# Patient Record
Sex: Female | Born: 1990
Health system: Southern US, Community
[De-identification: ages and names within clinical notes are randomized; demographics above are authoritative.]

## PROBLEM LIST (undated history)

## (undated) ENCOUNTER — Inpatient Hospital Stay (HOSPITAL_COMMUNITY): Payer: Self-pay

## (undated) DIAGNOSIS — N390 Urinary tract infection, site not specified: Secondary | ICD-10-CM

## (undated) DIAGNOSIS — J45909 Unspecified asthma, uncomplicated: Secondary | ICD-10-CM

## (undated) DIAGNOSIS — B9689 Other specified bacterial agents as the cause of diseases classified elsewhere: Secondary | ICD-10-CM

## (undated) DIAGNOSIS — R011 Cardiac murmur, unspecified: Secondary | ICD-10-CM

## (undated) DIAGNOSIS — E039 Hypothyroidism, unspecified: Secondary | ICD-10-CM

## (undated) DIAGNOSIS — A749 Chlamydial infection, unspecified: Secondary | ICD-10-CM

## (undated) HISTORY — DX: Chlamydial infection, unspecified: A74.9

## (undated) HISTORY — PX: NO PAST SURGERIES: SHX2092

## (undated) HISTORY — DX: Other specified bacterial agents as the cause of diseases classified elsewhere: B96.89

---
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
## 2011-06-02 ENCOUNTER — Encounter (HOSPITAL_COMMUNITY): Payer: Self-pay

## 2011-06-02 ENCOUNTER — Emergency Department (HOSPITAL_COMMUNITY)
Admission: EM | Admit: 2011-06-02 | Discharge: 2011-06-02 | Disposition: A | Discharge status: Home Health | Payer: PRIVATE HEALTH INSURANCE | Attending: Emergency Medicine | Admitting: Emergency Medicine

## 2011-06-02 DIAGNOSIS — K5289 Other specified noninfective gastroenteritis and colitis: Secondary | ICD-10-CM | POA: Insufficient documentation

## 2011-06-02 DIAGNOSIS — R109 Unspecified abdominal pain: Secondary | ICD-10-CM | POA: Insufficient documentation

## 2011-06-02 DIAGNOSIS — K529 Noninfective gastroenteritis and colitis, unspecified: Secondary | ICD-10-CM

## 2011-06-02 DIAGNOSIS — R112 Nausea with vomiting, unspecified: Secondary | ICD-10-CM | POA: Insufficient documentation

## 2011-06-02 LAB — CBC
HCT: 35.5 % — ABNORMAL LOW (ref 36.0–46.0)
Hemoglobin: 11.2 g/dL — ABNORMAL LOW (ref 12.0–15.0)
RBC: 4.14 MIL/uL (ref 3.87–5.11)
RDW: 13.5 % (ref 11.5–15.5)
WBC: 11.4 10*3/uL — ABNORMAL HIGH (ref 4.0–10.5)

## 2011-06-02 LAB — BASIC METABOLIC PANEL
CO2: 25 mEq/L (ref 19–32)
Chloride: 103 mEq/L (ref 96–112)
Creatinine, Ser: 0.58 mg/dL (ref 0.50–1.10)
Glucose, Bld: 105 mg/dL — ABNORMAL HIGH (ref 70–99)

## 2011-06-02 LAB — URINE MICROSCOPIC-ADD ON

## 2011-06-02 LAB — DIFFERENTIAL
Basophils Absolute: 0 10*3/uL (ref 0.0–0.1)
Lymphocytes Relative: 11 % — ABNORMAL LOW (ref 12–46)
Lymphs Abs: 1.2 10*3/uL (ref 0.7–4.0)
Monocytes Absolute: 0.3 10*3/uL (ref 0.1–1.0)
Monocytes Relative: 2 % — ABNORMAL LOW (ref 3–12)
Neutro Abs: 9.9 10*3/uL — ABNORMAL HIGH (ref 1.7–7.7)

## 2011-06-02 LAB — URINALYSIS, ROUTINE W REFLEX MICROSCOPIC
Bilirubin Urine: NEGATIVE
Glucose, UA: NEGATIVE mg/dL
Specific Gravity, Urine: 1.026 (ref 1.005–1.030)
pH: 8 (ref 5.0–8.0)

## 2011-06-02 LAB — POCT PREGNANCY, URINE: Preg Test, Ur: NEGATIVE

## 2011-06-02 MED ORDER — KETOROLAC TROMETHAMINE 30 MG/ML IJ SOLN
30.0000 mg | Freq: Once | INTRAMUSCULAR | Status: AC
  Administered 2011-06-02: 30 mg via INTRAVENOUS
  Filled 2011-06-02: qty 1

## 2011-06-02 MED ORDER — PROMETHAZINE HCL 25 MG PO TABS: 25.0000 mg | ORAL_TABLET | Freq: Four times a day (QID) | ORAL | Status: AC | PRN

## 2011-06-02 MED ORDER — SODIUM CHLORIDE 0.9 % IV SOLN
Freq: Once | INTRAVENOUS | Status: AC
  Administered 2011-06-02: 10:00:00 via INTRAVENOUS

## 2011-06-02 MED ORDER — ONDANSETRON HCL 4 MG/2ML IJ SOLN
4.0000 mg | Freq: Once | INTRAMUSCULAR | Status: AC
  Administered 2011-06-02: 4 mg via INTRAVENOUS
  Filled 2011-06-02: qty 2

## 2011-06-02 NOTE — ED Notes (Signed)
Pt complains of abd pain no other assosicated symptoms. Pt complains of also spotting, off birthcontrol since nov and sts could be pregenant.

## 2011-06-02 NOTE — ED Notes (Signed)
Patient presents with sharp, constant, bilateral lower quadrant abdominal pain since this AM with mild spotting.  LMP 05-10-11 which was normal. Patient denies other vaginal discharge or foul odor.

## 2011-06-02 NOTE — Discharge Instructions (Signed)
Viral Gastroenteritis Viral gastroenteritis is also known as stomach flu. This condition affects the stomach and intestinal tract. The illness typically lasts 3 to 8 days. Most people develop an immune response. This eventually gets rid of the virus. While this natural response develops, the virus can make you quite ill.  CAUSES  Diarrhea and vomiting are often caused by a virus. Medicines (antibiotics) that kill germs will not help unless there is also a germ (bacterial) infection. SYMPTOMS  The most common symptom is diarrhea. This can cause severe loss of fluids (dehydration) and body salt (electrolyte) imbalance. TREATMENT  Treatments for this illness are aimed at rehydration. Antidiarrheal medicines are not recommended. They do not decrease diarrhea volume and may be harmful. Usually, home treatment is all that is needed. The most serious cases involve vomiting so severely that you are not able to keep down fluids taken by mouth (orally). In these cases, intravenous (IV) fluids are needed. Vomiting with viral gastroenteritis is common, but it will usually go away with treatment. HOME CARE INSTRUCTIONS  Small amounts of fluids should be taken frequently. Large amounts at one time may not be tolerated. Plain water may be harmful in infants and the elderly. Oral rehydration solutions (ORS) are available at pharmacies and grocery stores. ORS replace water and important electrolytes in proper proportions. Sports drinks are not as effective as ORS and may be harmful due to sugars worsening diarrhea.  As a general guideline for children, replace any new fluid losses from diarrhea or vomiting with ORS as follows:   If your child weighs 22 pounds or under (10 kg or less), give 60-120 mL (1/4 - 1/2 cup or 2 - 4 ounces) of ORS for each diarrheal stool or vomiting episode.   If your child weighs more than 22 pounds (more than 10 kgs), give 120-240 mL (1/2 - 1 cup or 4 - 8 ounces) of ORS for each diarrheal  stool or vomiting episode.   In a child with vomiting, it may be helpful to give the above ORS replacement in 5 mL (1 teaspoon) amounts every 5 minutes, then increase as tolerated.   While correcting for dehydration, children should eat normally. However, foods high in sugar should be avoided because this may worsen diarrhea. Large amounts of carbonated soft drinks, juice, gelatin desserts, and other highly sugared drinks should be avoided.   After correction of dehydration, other liquids that are appealing to the child may be added. Children should drink small amounts of fluids frequently and fluids should be increased as tolerated.   Adults should eat normally while drinking more fluids than usual. Drink small amounts of fluids frequently and increase as tolerated. Drink enough water and fluids to keep your urine clear or pale yellow. Broths, weak decaffeinated tea, lemon-lime soft drinks (allowed to go flat), and ORS replace fluids and electrolytes.   Avoid:   Carbonated drinks.   Juice.   Extremely hot or cold fluids.   Caffeine drinks.   Fatty, greasy foods.   Alcohol.   Tobacco.   Too much intake of anything at one time.   Gelatin desserts.   Probiotics are active cultures of beneficial bacteria. They may lessen the amount and number of diarrheal stools in adults. Probiotics can be found in yogurt with active cultures and in supplements.   Wash your hands well to avoid spreading bacteria and viruses.   Antidiarrheal medicines are not recommended for infants and children.   Only take over-the-counter or prescription medicines for   pain, discomfort, or fever as directed by your caregiver. Do not give aspirin to children.   For adults with dehydration, ask your caregiver if you should continue all prescribed and over-the-counter medicines.   If your caregiver has given you a follow-up appointment, it is very important to keep that appointment. Not keeping the appointment  could result in a lasting (chronic) or permanent injury and disability. If there is any problem keeping the appointment, you must call to reschedule.  SEEK IMMEDIATE MEDICAL CARE IF:   You are unable to keep fluids down.   There is no urine output in 6 to 8 hours or there is only a small amount of very dark urine.   You develop shortness of breath.   There is blood in the vomit (may look like coffee grounds) or stool.   Belly (abdominal) pain develops, increases, or localizes.   There is persistent vomiting or diarrhea.   You have a fever.   Your baby is older than 3 months with a rectal temperature of 102 F (38.9 C) or higher.   Your baby is 3 months old or younger with a rectal temperature of 100.4 F (38 C) or higher.  MAKE SURE YOU:   Understand these instructions.   Will watch your condition.   Will get help right away if you are not doing well or get worse.  Document Released: 03/26/2005 Document Revised: 12/06/2010 Document Reviewed: 08/07/2006 [employer] Patient Information 2012 [employer]. 

## 2011-06-02 NOTE — ED Provider Notes (Signed)
History     CSN: [id number]  Arrival date & time 06/02/11  0865   First MD Initiated Contact with Patient 06/02/11 [phone]      Chief Complaint  Patient presents with  . Abdominal Pain    (Consider location/radiation/quality/duration/timing/severity/associated sxs/prior treatment) Patient is a 21 y.o. female presenting with abdominal pain. The history is provided by the patient.  Abdominal Pain The primary symptoms of the illness include abdominal pain, nausea, vomiting and diarrhea. The primary symptoms of the illness do not include fever, dysuria, vaginal discharge or vaginal bleeding. The current episode started yesterday. The problem has been gradually worsening.  The patient states that she believes she is currently not pregnant. The patient has had a change in bowel habit. Symptoms associated with the illness do not include chills, constipation, urgency or frequency.    History reviewed. No pertinent past medical history.  History reviewed. No pertinent past surgical history.  History reviewed. No pertinent family history.  History  Substance Use Topics  . Smoking status: Never Smoker   . Smokeless tobacco: Not on file  . Alcohol Use: No    OB History    Grav Para Term Preterm Abortions TAB SAB Ect Mult Living                  Review of Systems  Constitutional: Negative for fever and chills.  Gastrointestinal: Positive for nausea, vomiting, abdominal pain and diarrhea. Negative for constipation.  Genitourinary: Negative for dysuria, urgency, frequency, vaginal bleeding and vaginal discharge.  All other systems reviewed and are negative.    Allergies  Amoxicillin and Penicillins  Home Medications   Current Outpatient Rx  Name Route Sig Dispense Refill  . LORATADINE 10 MG PO TABS Oral Take 10 mg by mouth daily as needed. For allergies    . SALINE NASAL SPRAY 0.65 % NA SOLN Nasal Place 1 spray into the nose daily as needed. For congestion      BP 121/87   Pulse 75  Temp(Src) 98.5 F (36.9 C) (Oral)  Resp 18  SpO2 99%  LMP 05/10/2011  Physical Exam  Nursing note and vitals reviewed. Constitutional: She is oriented to person, place, and time. She appears well-developed and well-nourished. No distress.  HENT:  Head: Normocephalic and atraumatic.  Neck: Normal range of motion. Neck supple.  Cardiovascular: Normal rate and regular rhythm.  Exam reveals no gallop and no friction rub.   No murmur heard. Pulmonary/Chest: Effort normal and breath sounds normal. No respiratory distress. She has no wheezes.  Abdominal: Soft. Bowel sounds are normal. She exhibits no distension.       Mild ttp suprapubic region.  No rebound or guarding.  Musculoskeletal: Normal range of motion.  Neurological: She is alert and oriented to person, place, and time.  Skin: Skin is warm and dry. She is not diaphoretic.    ED Course  Procedures (including critical care time)   Labs Reviewed  POCT PREGNANCY, URINE  URINALYSIS, ROUTINE W REFLEX MICROSCOPIC  CBC  DIFFERENTIAL  BASIC METABOLIC PANEL   No results found.   No diagnosis found.    MDM  Patient reports symptoms to me that sound more GI in nature.  She feels better with fluids and medications given.  Will discharge to home, to return prn.        Geoffery Lyons, MD 06/02/11 1034

## 2017-02-20 ENCOUNTER — Ambulatory Visit (HOSPITAL_COMMUNITY)
Admission: EM | Admit: 2017-02-20 | Discharge: 2017-02-20 | Disposition: A | Discharge status: Home / Self Care | Payer: PRIVATE HEALTH INSURANCE | Attending: Family Medicine | Admitting: Family Medicine

## 2017-02-20 ENCOUNTER — Encounter (HOSPITAL_COMMUNITY): Payer: Self-pay

## 2017-02-20 ENCOUNTER — Other Ambulatory Visit: Payer: Self-pay

## 2017-02-20 DIAGNOSIS — K5900 Constipation, unspecified: Secondary | ICD-10-CM | POA: Diagnosis not present

## 2017-02-20 DIAGNOSIS — M545 Low back pain: Secondary | ICD-10-CM | POA: Insufficient documentation

## 2017-02-20 DIAGNOSIS — R350 Frequency of micturition: Secondary | ICD-10-CM | POA: Insufficient documentation

## 2017-02-20 LAB — POCT URINALYSIS DIP (DEVICE)
BILIRUBIN URINE: NEGATIVE
Glucose, UA: NEGATIVE mg/dL
HGB URINE DIPSTICK: NEGATIVE
Ketones, ur: NEGATIVE mg/dL
LEUKOCYTES UA: NEGATIVE
Nitrite: NEGATIVE
Protein, ur: NEGATIVE mg/dL
Specific Gravity, Urine: 1.015 (ref 1.005–1.030)
Urobilinogen, UA: 0.2 mg/dL (ref 0.0–1.0)
pH: 7 (ref 5.0–8.0)

## 2017-02-20 LAB — POCT PREGNANCY, URINE: Preg Test, Ur: NEGATIVE

## 2017-02-20 MED ORDER — POLYETHYLENE GLYCOL 3350 17 G PO PACK: 17.0000 g | PACK | Freq: Every day | ORAL | 0 refills | Status: DC

## 2017-02-20 MED ORDER — NAPROXEN 500 MG PO TABS: 500.0000 mg | ORAL_TABLET | Freq: Two times a day (BID) | ORAL | 0 refills | Status: AC

## 2017-02-20 MED ORDER — DOCUSATE SODIUM 50 MG PO CAPS: 50.0000 mg | ORAL_CAPSULE | Freq: Two times a day (BID) | ORAL | 0 refills | Status: DC

## 2017-02-20 NOTE — Discharge Instructions (Signed)
Urine negative for UTI/pregnancy. Urine culture sent, you will be contacted with any positive results, additional treatment needed will be called in then. As discussed, symptoms could also be due to constipation, muscle strain. Take naproxen as directed for pain and inflammation. Colace and miralax for constipation. Monitor for any worsening symptoms, fever, nausea, vomiting, weakness, dizziness, follow up for reevaluation.

## 2017-02-20 NOTE — ED Triage Notes (Signed)
[facility], pt states she was recently on antibiotics for Trich about 2 weeks ago, but upon waking this morning she has been having lower back pain and odor in urine

## 2017-02-20 NOTE — ED Provider Notes (Signed)
[facility]    CSN: [id number] Arrival date & time: 02/20/17  1800     History   Chief Complaint Chief Complaint  Patient presents with  . Urinary Tract Infection    HPI Sonya Castro is a 26 y.o. female.   26 year old female with 1 day history of left sided low back pain with urinary frequency, odor in urine. Back pain is a constant pressure, without aggravating or alleviating factor. Denies abdominal pain, nausea, vomiting. Recently treated for trichomonas on 10/20 and finished it 10/27. States medication was to cover for trichomonas, BV and yeast. Has not been sexually active since last tested. Denies vaginal discharge, itching/pain, spotting. LMP 02/04/2017. Patient with constipation, last BM today with some straining. Recently with new soap. Denies fever, chills, night sweats.        History reviewed. No pertinent past medical history.  There are no active problems to display for this patient.   History reviewed. No pertinent surgical history.  OB History    No data available       Home Medications    Prior to Admission medications   Medication Sig Start Date End Date Taking? Authorizing Provider  docusate sodium (COLACE) 50 MG capsule Take 1 capsule (50 mg total) 2 (two) times daily by mouth. 02/20/17   Valdez Brannan V, PA-C  loratadine (CLARITIN) 10 MG tablet Take 10 mg by mouth daily as needed. For allergies    [provider]  naproxen (NAPROSYN) 500 MG tablet Take 1 tablet (500 mg total) 2 (two) times daily for 10 days by mouth. 02/20/17 03/02/17  Cathie HoopsYu, Remmy Riffe V, PA-C  polyethylene glycol [facility](MIRALAX) packet Take 17 g daily by mouth. 02/20/17   Cathie HoopsYu, Johnita Palleschi V, PA-C  sodium chloride (OCEAN) 0.65 % nasal spray Place 1 spray into the nose daily as needed. For congestion    [provider]    Family History History reviewed. No pertinent family history.  Social History Social History   Tobacco Use  . Smoking status: Never Smoker  . Smokeless  tobacco: Never Used  Substance Use Topics  . Alcohol use: No  . Drug use: No     Allergies   Amoxicillin and Penicillins   Review of Systems Review of Systems  Reason unable to perform ROS: See HPI as above.     Physical Exam Triage Vital Signs ED Triage Vitals  Enc Vitals Group     BP 02/20/17 1831 128/70     Pulse Rate 02/20/17 1831 91     Resp 02/20/17 1831 16     Temp 02/20/17 1831 98.3 F (36.8 C)     Temp Source 02/20/17 1831 Oral     SpO2 02/20/17 1831 100 %     Weight --      Height --      Head Circumference --      Peak Flow --      Pain Score 02/20/17 1833 6     Pain Loc --      Pain Edu? --      Excl. in GC? --    No data found.  Updated Vital Signs BP 128/70 (BP Location: Right Arm)   Pulse 91   Temp 98.3 F (36.8 C) (Oral)   Resp 16   LMP 02/04/2017 (Exact Date)   SpO2 100%   Physical Exam  Constitutional: She is oriented to person, place, and time. She appears well-developed and well-nourished. No distress.  HENT:  Head: Normocephalic  and atraumatic.  Eyes: Conjunctivae are normal. Pupils are equal, round, and reactive to light.  Cardiovascular: Normal rate, regular rhythm and normal heart sounds. Exam reveals no gallop and no friction rub.  No murmur heard. Pulmonary/Chest: Effort normal and breath sounds normal. She has no wheezes. She has no rales.  Abdominal: Soft. Bowel sounds are normal. She exhibits no mass. There is no tenderness. There is no rebound, no guarding and no CVA tenderness.  Musculoskeletal:  No tenderness on palpation of spinous processes. States mild tenderness on palpation of left lumber region where experiencing pressure. Full ROM.   Neurological: She is alert and oriented to person, place, and time.  Skin: Skin is warm and dry.  Psychiatric: She has a normal mood and affect. Her behavior is normal. Judgment normal.     UC Treatments / Results  Labs (all labs ordered are listed, but only abnormal results are  displayed) Labs Reviewed  URINE CULTURE  POCT PREGNANCY, URINE  POCT URINALYSIS DIP (DEVICE)    EKG  EKG Interpretation None       Radiology No results found.  Procedures Procedures (including critical care time)  Medications Ordered in UC Medications - No data to display   Initial Impression / Assessment and Plan / UC Course  I have reviewed the triage vital signs and the nursing notes.  Pertinent labs & imaging results that were available during my care of the patient were reviewed by me and considered in my medical decision making (see chart for details).    Patient without tenderness to palpation of the abdomen, no CVA tenderness, afebrile, in no acute distress. Urine negative for UTI/pregnancy. Given symptoms, will send for culture. Discussed possible muscle strain/constipation causing symptoms. Start naproxen for back pain. Colace/miralax for constipation. Discontinue new exposures. Return precautions given.   Final Clinical Impressions(s) / UC Diagnoses   Final diagnoses:  Constipation, unspecified constipation type    ED Discharge Orders        Ordered    naproxen (NAPROSYN) 500 MG tablet  2 times daily     02/20/17 1910    docusate sodium (COLACE) 50 MG capsule  2 times daily     02/20/17 1910    polyethylene glycol (MIRALAX) packet  Daily     02/20/17 1910        Lurline IdolYu, Sammie Schermerhorn V, PA-C 02/20/17 1915

## 2017-02-22 LAB — URINE CULTURE: Culture: NO GROWTH

## 2017-02-26 ENCOUNTER — Emergency Department (HOSPITAL_BASED_OUTPATIENT_CLINIC_OR_DEPARTMENT_OTHER)
Admission: EM | Admit: 2017-02-26 | Discharge: 2017-02-27 | Disposition: A | Discharge status: Home / Self Care | Payer: PRIVATE HEALTH INSURANCE | Attending: Emergency Medicine | Admitting: Emergency Medicine

## 2017-02-26 ENCOUNTER — Other Ambulatory Visit: Payer: Self-pay

## 2017-02-26 ENCOUNTER — Encounter (HOSPITAL_BASED_OUTPATIENT_CLINIC_OR_DEPARTMENT_OTHER): Payer: Self-pay | Admitting: *Deleted

## 2017-02-26 DIAGNOSIS — Z8619 Personal history of other infectious and parasitic diseases: Secondary | ICD-10-CM | POA: Insufficient documentation

## 2017-02-26 DIAGNOSIS — N760 Acute vaginitis: Secondary | ICD-10-CM | POA: Insufficient documentation

## 2017-02-26 DIAGNOSIS — B9689 Other specified bacterial agents as the cause of diseases classified elsewhere: Secondary | ICD-10-CM | POA: Insufficient documentation

## 2017-02-26 LAB — URINALYSIS, ROUTINE W REFLEX MICROSCOPIC
BILIRUBIN URINE: NEGATIVE
Glucose, UA: NEGATIVE mg/dL
Hgb urine dipstick: NEGATIVE
Ketones, ur: NEGATIVE mg/dL
Leukocytes, UA: NEGATIVE
NITRITE: NEGATIVE
PH: 6 (ref 5.0–8.0)
Protein, ur: NEGATIVE mg/dL
SPECIFIC GRAVITY, URINE: 1.025 (ref 1.005–1.030)

## 2017-02-26 LAB — PREGNANCY, URINE: Preg Test, Ur: NEGATIVE

## 2017-02-26 NOTE — ED Triage Notes (Signed)
Vaginal irritation and odor. Itching.

## 2017-02-27 LAB — GC/CHLAMYDIA PROBE AMP (~~LOC~~) NOT AT ARMC
CHLAMYDIA, DNA PROBE: NEGATIVE
NEISSERIA GONORRHEA: NEGATIVE

## 2017-02-27 LAB — WET PREP, GENITAL
Sperm: NONE SEEN
Trich, Wet Prep: NONE SEEN
Yeast Wet Prep HPF POC: NONE SEEN

## 2017-02-27 MED ORDER — METRONIDAZOLE 500 MG PO TABS: 500.0000 mg | ORAL_TABLET | Freq: Two times a day (BID) | ORAL | 0 refills | Status: DC

## 2017-02-27 MED ORDER — METRONIDAZOLE 500 MG PO TABS
500.0000 mg | ORAL_TABLET | Freq: Once | ORAL | Status: AC
  Administered 2017-02-27: 500 mg via ORAL
  Filled 2017-02-27: qty 1

## 2017-02-27 NOTE — ED Provider Notes (Signed)
[facility] Provider Note: Lowella DellJ. Lane Giorgio Chabot, MD, FACEP  CSN: [id number] MRN: [id number] ARRIVAL: 02/26/17 at 2027 ROOM: [facility]   CHIEF COMPLAINT  Vaginal Itching   HISTORY OF PRESENT ILLNESS  02/27/17 12:09 AM Sonya Castro is a 26 y.o. female who was treated for trichomoniasis last month.  Since having her last menstrual period about 2 and half weeks ago she has had intermittent abnormal vaginal odor.  There is associated mild vaginal itching and discomfort but she denies frank pain.  She denies abnormal discharge.  She denies fever, chills, vaginal bleeding, nausea, vomiting or diarrhea.  She denies dysuria or hematuria.   History reviewed. No pertinent past medical history.  History reviewed. No pertinent surgical history.  No family history on file.  Social History   Tobacco Use  . Smoking status: Never Smoker  . Smokeless tobacco: Never Used  Substance Use Topics  . Alcohol use: No  . Drug use: No    Prior to Admission medications   Medication Sig Start Date End Date Taking? Authorizing Provider  docusate sodium (COLACE) 50 MG capsule Take 1 capsule (50 mg total) 2 (two) times daily by mouth. 02/20/17   Yu, Amy V, PA-C  loratadine (CLARITIN) 10 MG tablet Take 10 mg by mouth daily as needed. For allergies    [provider]  naproxen (NAPROSYN) 500 MG tablet Take 1 tablet (500 mg total) 2 (two) times daily for 10 days by mouth. 02/20/17 03/02/17  Cathie HoopsYu, Amy V, PA-C  polyethylene glycol [facility](MIRALAX) packet Take 17 g daily by mouth. 02/20/17   Cathie HoopsYu, Amy V, PA-C  sodium chloride (OCEAN) 0.65 % nasal spray Place 1 spray into the nose daily as needed. For congestion    [provider]    Allergies Amoxicillin and Penicillins   REVIEW OF SYSTEMS  Negative except as noted here or in the History of Present Illness.   PHYSICAL EXAMINATION  Initial Vital Signs Blood pressure 100/60, pulse 84, temperature 97.8 F (36.6 C), temperature source Oral,  resp. rate 18, height 5' (1.524 m), weight 83.9 kg (185 lb), last menstrual period 02/04/2017, SpO2 100 %.  Examination General: Well-developed, well-nourished female in no acute distress; appearance consistent with age of record HENT: normocephalic; atraumatic Eyes: pupils equal, round and reactive to light; extraocular muscles intact Neck: supple Heart: regular rate and rhythm Lungs: clear to auscultation bilaterally Abdomen: soft; nondistended; minimal suprapubic tenderness; no masses or hepatosplenomegaly; bowel sounds present GU: Normal external genitalia; physiologic appearing vaginal discharge; no vaginal bleeding; no cervical motion tenderness; minimal bilateral adnexal tenderness Extremities: No deformity; full range of motion; pulses normal Neurologic: Awake, alert and oriented; motor function intact in all extremities and symmetric; no facial droop Skin: Warm and dry Psychiatric: Normal mood and affect   RESULTS  Summary of this visit's results, reviewed by myself:   EKG Interpretation  Date/Time:    Ventricular Rate:    PR Interval:    QRS Duration:   QT Interval:    QTC Calculation:   R Axis:     Text Interpretation:        Laboratory Studies: Results for orders placed or performed during the hospital encounter of 02/26/17 (from the past 24 hour(s))  Pregnancy, urine     Status: None   Collection Time: 02/26/17  8:55 PM  Result Value Ref Range   Preg Test, Ur NEGATIVE NEGATIVE  Urinalysis, Routine w reflex microscopic     Status: None   Collection Time: 02/26/17  8:55 PM  Result Value Ref Range   Color, Urine YELLOW YELLOW   APPearance CLEAR CLEAR   Specific Gravity, Urine 1.025 1.005 - 1.030   pH 6.0 5.0 - 8.0   Glucose, UA NEGATIVE NEGATIVE mg/dL   Hgb urine dipstick NEGATIVE NEGATIVE   Bilirubin Urine NEGATIVE NEGATIVE   Ketones, ur NEGATIVE NEGATIVE mg/dL   Protein, ur NEGATIVE NEGATIVE mg/dL   Nitrite NEGATIVE NEGATIVE   Leukocytes, UA NEGATIVE  NEGATIVE  Wet prep, genital     Status: Abnormal   Collection Time: 02/27/17 12:18 AM  Result Value Ref Range   Yeast Wet Prep HPF POC NONE SEEN NONE SEEN   Trich, Wet Prep NONE SEEN NONE SEEN   Clue Cells Wet Prep HPF POC PRESENT (A) NONE SEEN   WBC, Wet Prep HPF POC MANY (A) NONE SEEN   Sperm NONE SEEN    Imaging Studies: No results found.  ED COURSE  Nursing notes and initial vitals signs, including pulse oximetry, reviewed.  Vitals:   02/26/17 2053 02/26/17 2058 02/26/17 2345  BP:  113/76 100/60  Pulse:  (!) 101 84  Resp:  18 18  Temp:  97.8 F (36.6 C)   TempSrc:  Oral   SpO2:  100% 100%  Weight: 83.9 kg (185 lb)    Height: 5' (1.524 m)      PROCEDURES    ED DIAGNOSES     ICD-10-CM   1. Bacterial vaginosis N76.0    B96.89        Amberle Lyter, MD 02/27/17 [id number]

## 2019-03-10 DIAGNOSIS — Z0189 Encounter for other specified special examinations: Secondary | ICD-10-CM | POA: Diagnosis not present

## 2019-03-10 DIAGNOSIS — O26892 Other specified pregnancy related conditions, second trimester: Secondary | ICD-10-CM | POA: Diagnosis not present

## 2019-03-10 DIAGNOSIS — R519 Headache, unspecified: Secondary | ICD-10-CM | POA: Diagnosis not present

## 2019-03-10 DIAGNOSIS — Z3A15 15 weeks gestation of pregnancy: Secondary | ICD-10-CM | POA: Diagnosis not present

## 2019-03-10 DIAGNOSIS — O26891 Other specified pregnancy related conditions, first trimester: Secondary | ICD-10-CM | POA: Diagnosis not present

## 2019-03-17 DIAGNOSIS — Z3402 Encounter for supervision of normal first pregnancy, second trimester: Secondary | ICD-10-CM | POA: Diagnosis not present

## 2019-03-26 DIAGNOSIS — Z03818 Encounter for observation for suspected exposure to other biological agents ruled out: Secondary | ICD-10-CM | POA: Diagnosis not present

## 2019-03-26 DIAGNOSIS — Z20828 Contact with and (suspected) exposure to other viral communicable diseases: Secondary | ICD-10-CM | POA: Diagnosis not present

## 2019-04-09 DIAGNOSIS — Z3492 Encounter for supervision of normal pregnancy, unspecified, second trimester: Secondary | ICD-10-CM | POA: Diagnosis not present

## 2019-05-06 DIAGNOSIS — Z3A23 23 weeks gestation of pregnancy: Secondary | ICD-10-CM | POA: Diagnosis not present

## 2019-05-06 DIAGNOSIS — R1084 Generalized abdominal pain: Secondary | ICD-10-CM | POA: Diagnosis not present

## 2019-05-06 DIAGNOSIS — O26892 Other specified pregnancy related conditions, second trimester: Secondary | ICD-10-CM | POA: Diagnosis not present

## 2019-05-14 DIAGNOSIS — E039 Hypothyroidism, unspecified: Secondary | ICD-10-CM | POA: Diagnosis not present

## 2019-05-14 DIAGNOSIS — N898 Other specified noninflammatory disorders of vagina: Secondary | ICD-10-CM | POA: Diagnosis not present

## 2019-06-11 DIAGNOSIS — Z3402 Encounter for supervision of normal first pregnancy, second trimester: Secondary | ICD-10-CM | POA: Diagnosis not present

## 2019-06-11 DIAGNOSIS — Z3A28 28 weeks gestation of pregnancy: Secondary | ICD-10-CM | POA: Diagnosis not present

## 2019-06-11 DIAGNOSIS — Z331 Pregnant state, incidental: Secondary | ICD-10-CM | POA: Diagnosis not present

## 2019-06-11 DIAGNOSIS — Z3403 Encounter for supervision of normal first pregnancy, third trimester: Secondary | ICD-10-CM | POA: Diagnosis not present

## 2019-06-30 DIAGNOSIS — O26849 Uterine size-date discrepancy, unspecified trimester: Secondary | ICD-10-CM | POA: Diagnosis not present

## 2019-06-30 DIAGNOSIS — Z3493 Encounter for supervision of normal pregnancy, unspecified, third trimester: Secondary | ICD-10-CM | POA: Diagnosis not present

## 2019-07-30 DIAGNOSIS — F419 Anxiety disorder, unspecified: Secondary | ICD-10-CM | POA: Diagnosis not present

## 2019-07-30 DIAGNOSIS — Z3483 Encounter for supervision of other normal pregnancy, third trimester: Secondary | ICD-10-CM | POA: Diagnosis not present

## 2019-07-30 DIAGNOSIS — O99344 Other mental disorders complicating childbirth: Secondary | ICD-10-CM | POA: Diagnosis not present

## 2019-07-30 DIAGNOSIS — Z331 Pregnant state, incidental: Secondary | ICD-10-CM | POA: Diagnosis not present

## 2019-07-30 DIAGNOSIS — O42013 Preterm premature rupture of membranes, onset of labor within 24 hours of rupture, third trimester: Secondary | ICD-10-CM | POA: Diagnosis not present

## 2019-07-30 DIAGNOSIS — D649 Anemia, unspecified: Secondary | ICD-10-CM | POA: Insufficient documentation

## 2019-07-30 DIAGNOSIS — O42919 Preterm premature rupture of membranes, unspecified as to length of time between rupture and onset of labor, unspecified trimester: Secondary | ICD-10-CM | POA: Diagnosis not present

## 2019-07-30 DIAGNOSIS — E669 Obesity, unspecified: Secondary | ICD-10-CM | POA: Diagnosis not present

## 2019-07-30 DIAGNOSIS — O42913 Preterm premature rupture of membranes, unspecified as to length of time between rupture and onset of labor, third trimester: Secondary | ICD-10-CM | POA: Diagnosis not present

## 2019-07-30 DIAGNOSIS — O99214 Obesity complicating childbirth: Secondary | ICD-10-CM | POA: Diagnosis not present

## 2019-07-30 DIAGNOSIS — M6250 Muscle wasting and atrophy, not elsewhere classified, unspecified site: Secondary | ICD-10-CM | POA: Diagnosis not present

## 2019-07-30 DIAGNOSIS — Z743 Need for continuous supervision: Secondary | ICD-10-CM | POA: Diagnosis not present

## 2019-07-30 DIAGNOSIS — E039 Hypothyroidism, unspecified: Secondary | ICD-10-CM | POA: Diagnosis not present

## 2019-07-30 DIAGNOSIS — Z3A35 35 weeks gestation of pregnancy: Secondary | ICD-10-CM | POA: Diagnosis not present

## 2019-07-30 DIAGNOSIS — O99284 Endocrine, nutritional and metabolic diseases complicating childbirth: Secondary | ICD-10-CM | POA: Diagnosis not present

## 2019-07-30 DIAGNOSIS — Z880 Allergy status to penicillin: Secondary | ICD-10-CM | POA: Diagnosis not present

## 2019-09-17 DIAGNOSIS — Z30430 Encounter for insertion of intrauterine contraceptive device: Secondary | ICD-10-CM | POA: Diagnosis not present

## 2019-10-27 DIAGNOSIS — N939 Abnormal uterine and vaginal bleeding, unspecified: Secondary | ICD-10-CM | POA: Diagnosis not present

## 2019-10-27 DIAGNOSIS — E039 Hypothyroidism, unspecified: Secondary | ICD-10-CM | POA: Diagnosis not present

## 2019-10-27 DIAGNOSIS — Z3040 Encounter for surveillance of contraceptives, unspecified: Secondary | ICD-10-CM | POA: Diagnosis not present

## 2020-08-26 ENCOUNTER — Emergency Department
Admission: EM | Admit: 2020-08-26 | Discharge: 2020-08-26 | Disposition: A | Discharge status: Home / Self Care | Payer: Medicaid Other | Attending: Emergency Medicine | Admitting: Emergency Medicine

## 2020-08-26 ENCOUNTER — Other Ambulatory Visit: Payer: Self-pay

## 2020-08-26 DIAGNOSIS — R109 Unspecified abdominal pain: Secondary | ICD-10-CM | POA: Insufficient documentation

## 2020-08-26 DIAGNOSIS — R300 Dysuria: Secondary | ICD-10-CM | POA: Insufficient documentation

## 2020-08-26 DIAGNOSIS — R197 Diarrhea, unspecified: Secondary | ICD-10-CM | POA: Insufficient documentation

## 2020-08-26 DIAGNOSIS — R059 Cough, unspecified: Secondary | ICD-10-CM | POA: Diagnosis not present

## 2020-08-26 DIAGNOSIS — Z20822 Contact with and (suspected) exposure to covid-19: Secondary | ICD-10-CM | POA: Insufficient documentation

## 2020-08-26 DIAGNOSIS — R112 Nausea with vomiting, unspecified: Secondary | ICD-10-CM | POA: Diagnosis not present

## 2020-08-26 LAB — URINALYSIS, COMPLETE (UACMP) WITH MICROSCOPIC
Bilirubin Urine: NEGATIVE
Glucose, UA: NEGATIVE mg/dL
Ketones, ur: NEGATIVE mg/dL
Nitrite: NEGATIVE
Protein, ur: NEGATIVE mg/dL
Specific Gravity, Urine: 1.024 (ref 1.005–1.030)
pH: 5 (ref 5.0–8.0)

## 2020-08-26 LAB — COMPREHENSIVE METABOLIC PANEL
ALT: 17 U/L (ref 0–44)
AST: 20 U/L (ref 15–41)
Albumin: 4.3 g/dL (ref 3.5–5.0)
Alkaline Phosphatase: 77 U/L (ref 38–126)
Anion gap: 8 (ref 5–15)
BUN: 9 mg/dL (ref 6–20)
CO2: 22 mmol/L (ref 22–32)
Calcium: 8.8 mg/dL — ABNORMAL LOW (ref 8.9–10.3)
Chloride: 105 mmol/L (ref 98–111)
Creatinine, Ser: 0.53 mg/dL (ref 0.44–1.00)
GFR, Estimated: >60 mL/min (ref >60)
Glucose, Bld: 132 mg/dL — ABNORMAL HIGH (ref 70–99)
Potassium: 3.9 mmol/L (ref 3.5–5.1)
Sodium: 135 mmol/L (ref 135–145)
Total Bilirubin: 0.5 mg/dL (ref 0.3–1.2)
Total Protein: 8 g/dL (ref 6.5–8.1)

## 2020-08-26 LAB — RESP PANEL BY RT-PCR (FLU A&B, COVID) ARPGX2
Influenza A by PCR: NEGATIVE
Influenza B by PCR: NEGATIVE
SARS Coronavirus 2 by RT PCR: NEGATIVE

## 2020-08-26 LAB — CBC
HCT: 37.4 % (ref 36.0–46.0)
Hemoglobin: 12 g/dL (ref 12.0–15.0)
MCH: 26.3 pg (ref 26.0–34.0)
MCHC: 32.1 g/dL (ref 30.0–36.0)
MCV: 82 fL (ref 80.0–100.0)
Platelets: 256 10*3/uL (ref 150–400)
RBC: 4.56 MIL/uL (ref 3.87–5.11)
RDW: 14.6 % (ref 11.5–15.5)
WBC: 13.8 10*3/uL — ABNORMAL HIGH (ref 4.0–10.5)
nRBC: 0 % (ref 0.0–0.2)

## 2020-08-26 LAB — LIPASE, BLOOD: Lipase: 26 U/L (ref 11–51)

## 2020-08-26 LAB — POC URINE PREG, ED: Preg Test, Ur: NEGATIVE

## 2020-08-26 MED ORDER — FOSFOMYCIN TROMETHAMINE 3 G PO PACK
3.0000 g | PACK | Freq: Once | ORAL | Status: AC
  Administered 2020-08-26: 3 g via ORAL
  Filled 2020-08-26: qty 3

## 2020-08-26 MED ORDER — ONDANSETRON HCL 4 MG/2ML IJ SOLN
4.0000 mg | Freq: Once | INTRAMUSCULAR | Status: AC
  Administered 2020-08-26: 4 mg via INTRAVENOUS
  Filled 2020-08-26: qty 2

## 2020-08-26 MED ORDER — DICYCLOMINE HCL 10 MG PO CAPS: 10.0000 mg | ORAL_CAPSULE | Freq: Three times a day (TID) | ORAL | 0 refills | Status: DC | PRN

## 2020-08-26 MED ORDER — SODIUM CHLORIDE 0.9 % IV BOLUS
1000.0000 mL | Freq: Once | INTRAVENOUS | Status: AC
  Administered 2020-08-26: 1000 mL via INTRAVENOUS

## 2020-08-26 MED ORDER — KETOROLAC TROMETHAMINE 30 MG/ML IJ SOLN
30.0000 mg | Freq: Once | INTRAMUSCULAR | Status: AC
  Administered 2020-08-26: 30 mg via INTRAVENOUS
  Filled 2020-08-26: qty 1

## 2020-08-26 MED ORDER — ONDANSETRON 4 MG PO TBDP: 4.0000 mg | ORAL_TABLET | Freq: Three times a day (TID) | ORAL | 0 refills | Status: DC | PRN

## 2020-08-26 NOTE — ED Triage Notes (Signed)
Pt in with co n.v.d since am with generalized abd pain. No fever, has had some dysuria.

## 2020-08-26 NOTE — ED Provider Notes (Signed)
[facility] Emergency Department Provider Note  Time seen: 5:44 AM  I have reviewed the triage vital signs and the nursing notes.   HISTORY  Chief Complaint Abdominal Pain   HPI Sonya Castro is a 30 y.o. female with no significant past medical history presents to the emergency department with complaints of nausea vomiting diarrhea and dysuria.  According to the patient for the past 2 days she has been experiencing nausea vomiting diarrhea with vague abdominal cramping as well as mild dysuria which she describes as a pressure sensation when she urinates.  Patient denies any fever but states she has had chills at home.  She has also had a dry cough at times per patient as well but no known fever.  Does not feel short of breath.  Denies any focal abdominal pain but states generalized cramping.   No past medical history on file.  There are no problems to display for this patient.   No past surgical history on file.  Prior to Admission medications   Medication Sig Start Date End Date Taking? Authorizing Provider  docusate sodium (COLACE) 50 MG capsule Take 1 capsule (50 mg total) 2 (two) times daily by mouth. 02/20/17   Yu, Amy V, PA-C  loratadine (CLARITIN) 10 MG tablet Take 10 mg by mouth daily as needed. For allergies    [provider]  metroNIDAZOLE (FLAGYL) 500 MG tablet Take 1 tablet (500 mg total) by mouth 2 (two) times daily. One po bid x 7 days 02/27/17   Molpus, John, MD  polyethylene glycol [facility]) packet Take 17 g daily by mouth. 02/20/17   Cathie Hoops, Amy V, PA-C  sodium chloride (OCEAN) 0.65 % nasal spray Place 1 spray into the nose daily as needed. For congestion    [provider]    Allergies  Allergen Reactions  . Amoxicillin Hives  . Penicillins Hives, Itching and Swelling    No family history on file.  Social History Social History   Tobacco Use  . Smoking status: Never Smoker  . Smokeless tobacco: Never Used  Substance  Use Topics  . Alcohol use: No  . Drug use: No    Review of Systems Constitutional: Negative for fever.  Positive for chills Cardiovascular: Negative for chest pain. Respiratory: Negative for shortness of breath. Gastrointestinal: Positive for abdominal pain/cramping.  Positive nausea vomiting diarrhea. Genitourinary: Mild pressure when urinating. Musculoskeletal: Negative for musculoskeletal complaints Neurological: Negative for headache All other ROS negative  ____________________________________________   PHYSICAL EXAM:  VITAL SIGNS: ED Triage Vitals  Enc Vitals Group     BP 08/26/20 0045 122/70     Pulse Rate 08/26/20 0045 (!) 118     Resp 08/26/20 0045 20     Temp 08/26/20 0045 98.9 F (37.2 C)     Temp Source 08/26/20 0045 Oral     SpO2 08/26/20 0045 99 %     Weight 08/26/20 0044 172 lb (78 kg)     Height 08/26/20 0044 5' (1.524 m)     Head Circumference --      Peak Flow --      Pain Score 08/26/20 0044 9     Pain Loc --      Pain Edu? --      Excl. in GC? --     Constitutional: Alert and oriented. Well appearing and in no distress. Eyes: Normal exam ENT      Head: Normocephalic and atraumatic.      Mouth/Throat: Mucous membranes  are moist. Cardiovascular: Normal rate, regular rhythm.  Respiratory: Normal respiratory effort without tachypnea nor retractions. Breath sounds are clear Gastrointestinal: Soft, slight diffuse tenderness without focal tenderness identified.  No rebound guarding or distention. Musculoskeletal: Nontender with normal range of motion in all extremities. Neurologic:  Normal speech and language. No gross focal neurologic deficits  Skin:  Skin is warm, dry and intact.  Psychiatric: Mood and affect are normal.   ____________________________________________   INITIAL IMPRESSION / ASSESSMENT AND PLAN / ED COURSE  Pertinent labs & imaging results that were available during my care of the patient were reviewed by me and considered in my  medical decision making (see chart for details).   Patient presents emergency department for nausea vomiting diarrhea and mild dysuria.  Patient's lab work shows mild leukocytosis.  Abdomen is overall reassuring on exam very slight diffuse tenderness without focal tenderness identified.  Highly suspect gastroenteritis.  Patient's urinalysis is equivocal for urinary tract infection.  Given the patient's symptoms we will treat with a one-time dose of fosfomycin NSTEMI urine culture.  We will IV hydrate, treat nausea and continue to closely monitor.  Patient agreeable to plan.  Patient's work-up shows equivocal UTI otherwise negative.  COVID-negative.  We will discharge patient home with Bentyl and Zofran have the patient follow-up with her doctor.  Sonya Castro was evaluated in Emergency Department on 08/26/2020 for the symptoms described in the history of present illness. She was evaluated in the context of the global COVID-19 pandemic, which necessitated consideration that the patient might be at risk for infection with the SARS-CoV-2 virus that causes COVID-19. Institutional protocols and algorithms that pertain to the evaluation of patients at risk for COVID-19 are in a state of rapid change based on information released by regulatory bodies including the CDC and federal and state organizations. These policies and algorithms were followed during the patient's care in the ED.  ____________________________________________   FINAL CLINICAL IMPRESSION(S) / ED DIAGNOSES  Nausea vomiting diarrhea Dysuria   Minna Antis, MD 08/26/20 [phone]

## 2020-08-27 LAB — URINE CULTURE: Culture: 80000 — AB

## 2020-11-26 ENCOUNTER — Emergency Department (HOSPITAL_BASED_OUTPATIENT_CLINIC_OR_DEPARTMENT_OTHER)
Admission: EM | Admit: 2020-11-26 | Discharge: 2020-11-26 | Disposition: A | Discharge status: Home / Self Care | Payer: Medicaid Other | Attending: Emergency Medicine | Admitting: Emergency Medicine

## 2020-11-26 ENCOUNTER — Encounter (HOSPITAL_BASED_OUTPATIENT_CLINIC_OR_DEPARTMENT_OTHER): Payer: Self-pay

## 2020-11-26 DIAGNOSIS — N898 Other specified noninflammatory disorders of vagina: Secondary | ICD-10-CM | POA: Diagnosis not present

## 2020-11-26 DIAGNOSIS — J069 Acute upper respiratory infection, unspecified: Secondary | ICD-10-CM | POA: Diagnosis not present

## 2020-11-26 DIAGNOSIS — Z5321 Procedure and treatment not carried out due to patient leaving prior to being seen by health care provider: Secondary | ICD-10-CM | POA: Insufficient documentation

## 2020-11-26 NOTE — ED Notes (Signed)
Pt called for a room. No response.

## 2020-11-26 NOTE — ED Notes (Signed)
This RN went through the lobby and called for patient. No response. Patient not found. Unknown condition.

## 2020-11-26 NOTE — ED Triage Notes (Signed)
C/o uri sx x 1 1/2 weeks. She c/o mild lower pelvic discomfort which started today, preceded by a vaginal "odor" for past few days. She is ambulatory and in no distress.

## 2020-12-01 ENCOUNTER — Emergency Department: Payer: Medicaid Other

## 2020-12-01 ENCOUNTER — Other Ambulatory Visit: Payer: Self-pay

## 2020-12-01 DIAGNOSIS — R0789 Other chest pain: Secondary | ICD-10-CM | POA: Diagnosis not present

## 2020-12-01 DIAGNOSIS — N898 Other specified noninflammatory disorders of vagina: Secondary | ICD-10-CM | POA: Diagnosis present

## 2020-12-01 DIAGNOSIS — Z20822 Contact with and (suspected) exposure to covid-19: Secondary | ICD-10-CM | POA: Diagnosis not present

## 2020-12-01 DIAGNOSIS — J40 Bronchitis, not specified as acute or chronic: Secondary | ICD-10-CM | POA: Diagnosis not present

## 2020-12-01 DIAGNOSIS — N760 Acute vaginitis: Secondary | ICD-10-CM | POA: Insufficient documentation

## 2020-12-01 DIAGNOSIS — B9689 Other specified bacterial agents as the cause of diseases classified elsewhere: Secondary | ICD-10-CM | POA: Insufficient documentation

## 2020-12-01 DIAGNOSIS — R079 Chest pain, unspecified: Secondary | ICD-10-CM | POA: Diagnosis not present

## 2020-12-01 DIAGNOSIS — R0602 Shortness of breath: Secondary | ICD-10-CM | POA: Diagnosis not present

## 2020-12-01 LAB — URINALYSIS, COMPLETE (UACMP) WITH MICROSCOPIC
Bacteria, UA: NONE SEEN
Bilirubin Urine: NEGATIVE
Glucose, UA: NEGATIVE mg/dL
Hgb urine dipstick: NEGATIVE
Ketones, ur: NEGATIVE mg/dL
Nitrite: NEGATIVE
Protein, ur: NEGATIVE mg/dL
Specific Gravity, Urine: 1.023 (ref 1.005–1.030)
pH: 7 (ref 5.0–8.0)

## 2020-12-01 LAB — CBC
HCT: 35.3 % — ABNORMAL LOW (ref 36.0–46.0)
Hemoglobin: 11.5 g/dL — ABNORMAL LOW (ref 12.0–15.0)
MCH: 26.9 pg (ref 26.0–34.0)
MCHC: 32.6 g/dL (ref 30.0–36.0)
MCV: 82.7 fL (ref 80.0–100.0)
Platelets: 181 10*3/uL (ref 150–400)
RBC: 4.27 MIL/uL (ref 3.87–5.11)
RDW: 14.5 % (ref 11.5–15.5)
WBC: 12.7 10*3/uL — ABNORMAL HIGH (ref 4.0–10.5)
nRBC: 0 % (ref 0.0–0.2)

## 2020-12-01 LAB — COMPREHENSIVE METABOLIC PANEL
ALT: 15 U/L (ref 0–44)
AST: 16 U/L (ref 15–41)
Albumin: 3.9 g/dL (ref 3.5–5.0)
Alkaline Phosphatase: 66 U/L (ref 38–126)
Anion gap: 7 (ref 5–15)
BUN: 10 mg/dL (ref 6–20)
CO2: 26 mmol/L (ref 22–32)
Calcium: 9 mg/dL (ref 8.9–10.3)
Chloride: 104 mmol/L (ref 98–111)
Creatinine, Ser: 0.63 mg/dL (ref 0.44–1.00)
GFR, Estimated: >60 mL/min (ref >60)
Glucose, Bld: 94 mg/dL (ref 70–99)
Potassium: 3.7 mmol/L (ref 3.5–5.1)
Sodium: 137 mmol/L (ref 135–145)
Total Bilirubin: 0.6 mg/dL (ref 0.3–1.2)
Total Protein: 7.4 g/dL (ref 6.5–8.1)

## 2020-12-01 LAB — TROPONIN I (HIGH SENSITIVITY): Troponin I (High Sensitivity): <2 ng/L (ref <18)

## 2020-12-01 LAB — POC URINE PREG, ED: Preg Test, Ur: NEGATIVE

## 2020-12-01 NOTE — ED Triage Notes (Signed)
Pt in with co shob and chest heaviness since this am has had cough and congestion. Pt also co pain to entire pelvic area for a week. Pt does have a foul odor denies any discharge.

## 2020-12-02 ENCOUNTER — Emergency Department
Admission: EM | Admit: 2020-12-02 | Discharge: 2020-12-02 | Disposition: A | Discharge status: Home / Self Care | Payer: Medicaid Other | Attending: Emergency Medicine | Admitting: Emergency Medicine

## 2020-12-02 DIAGNOSIS — J40 Bronchitis, not specified as acute or chronic: Secondary | ICD-10-CM

## 2020-12-02 DIAGNOSIS — N760 Acute vaginitis: Secondary | ICD-10-CM

## 2020-12-02 LAB — WET PREP, GENITAL
Sperm: NONE SEEN
Trich, Wet Prep: NONE SEEN
Yeast Wet Prep HPF POC: NONE SEEN

## 2020-12-02 LAB — RESP PANEL BY RT-PCR (FLU A&B, COVID) ARPGX2
Influenza A by PCR: NEGATIVE
Influenza B by PCR: NEGATIVE
SARS Coronavirus 2 by RT PCR: NEGATIVE

## 2020-12-02 LAB — CHLAMYDIA/NGC RT PCR (ARMC ONLY)
Chlamydia Tr: NOT DETECTED
N gonorrhoeae: NOT DETECTED

## 2020-12-02 LAB — TROPONIN I (HIGH SENSITIVITY): Troponin I (High Sensitivity): <2 ng/L (ref <18)

## 2020-12-02 MED ORDER — PREDNISONE 20 MG PO TABS
60.0000 mg | ORAL_TABLET | Freq: Once | ORAL | Status: AC
  Administered 2020-12-02: 60 mg via ORAL
  Filled 2020-12-02: qty 3

## 2020-12-02 MED ORDER — ALBUTEROL SULFATE HFA 108 (90 BASE) MCG/ACT IN AERS: 2.0000 | INHALATION_SPRAY | Freq: Four times a day (QID) | RESPIRATORY_TRACT | 2 refills | Status: DC | PRN

## 2020-12-02 MED ORDER — IPRATROPIUM-ALBUTEROL 0.5-2.5 (3) MG/3ML IN SOLN
3.0000 mL | Freq: Once | RESPIRATORY_TRACT | Status: AC
  Administered 2020-12-02: 3 mL via RESPIRATORY_TRACT
  Filled 2020-12-02: qty 9

## 2020-12-02 MED ORDER — IPRATROPIUM-ALBUTEROL 0.5-2.5 (3) MG/3ML IN SOLN
3.0000 mL | Freq: Once | RESPIRATORY_TRACT | Status: AC
  Administered 2020-12-02: 3 mL via RESPIRATORY_TRACT

## 2020-12-02 MED ORDER — PREDNISONE 20 MG PO TABS: 60.0000 mg | ORAL_TABLET | Freq: Every day | ORAL | 0 refills | Status: AC

## 2020-12-02 MED ORDER — METRONIDAZOLE 500 MG PO TABS
500.0000 mg | ORAL_TABLET | Freq: Once | ORAL | Status: AC
  Administered 2020-12-02: 500 mg via ORAL
  Filled 2020-12-02: qty 1

## 2020-12-02 MED ORDER — METRONIDAZOLE 500 MG PO TABS: 500.0000 mg | ORAL_TABLET | Freq: Two times a day (BID) | ORAL | 0 refills | Status: AC

## 2020-12-02 NOTE — ED Provider Notes (Signed)
[facility] Emergency Department Provider Note  ____________________________________________  Time seen: Approximately 4:43 AM  I have reviewed the triage vital signs and the nursing notes.   HISTORY  Chief Complaint Shortness of Breath and Abdominal Pain   HPI Sonya Castro is a 30 y.o. female who presents to the emergency department for 2 complaints.  Patient reports that she was initially coming due to a foul vaginal discharge that she has had for about a week.  She denies any vaginal pain, abdominal pain, or pain with intercourse.  She reports a heavy several prior episodes of bacterial vaginitis which usually presents like that.  She is also reporting that this evening she started having tightness in her chest, and wheezing.  She has had a cough and congestion that started this morning.  She reports that she has had a history of bronchitis in the past but did not have any [place] at home.  She was an inhaler of a friend and that helped a little bit.  She still complaining of mild chest tightness and wheezing.  She denies fever or chills.  She denies personal or family history.  DVT, recent travel immobilization, leg pain or swelling, hemoptysis or exogenous hormones.   PMH Bacterial vaginitis Bronchitis  Prior to Admission medications   Medication Sig Start Date End Date Taking? Authorizing Provider  albuterol (VENTOLIN HFA) 108 (90 Base) MCG/ACT inhaler Inhale 2 puffs into the lungs every 6 (six) hours as needed for wheezing or shortness of breath. 12/02/20  Yes Don Perking, [place], MD  metroNIDAZOLE (FLAGYL) 500 MG tablet Take 1 tablet (500 mg total) by mouth 2 (two) times daily for 7 days. 12/02/20 12/09/20 Yes Kasson Lamere, [place], MD  predniSONE (DELTASONE) 20 MG tablet Take 3 tablets (60 mg total) by mouth daily with breakfast for 4 days. 12/02/20 12/06/20 Yes Jayme Cham, [place], MD  dicyclomine (BENTYL) 10 MG capsule Take 1 capsule (10 mg total) by mouth 3  (three) times daily as needed for up to 14 days for spasms. 08/26/20 09/09/20  Minna Antis, MD  docusate sodium (COLACE) 50 MG capsule Take 1 capsule (50 mg total) 2 (two) times daily by mouth. 02/20/17   Yu, Amy V, PA-C  loratadine (CLARITIN) 10 MG tablet Take 10 mg by mouth daily as needed. For allergies    [provider]  ondansetron (ZOFRAN ODT) 4 MG disintegrating tablet Take 1 tablet (4 mg total) by mouth every 8 (eight) hours as needed for nausea or vomiting. 08/26/20   Minna Antis, MD  polyethylene glycol [facility]) packet Take 17 g daily by mouth. 02/20/17   Cathie Hoops, Amy V, PA-C  sodium chloride (OCEAN) 0.65 % nasal spray Place 1 spray into the nose daily as needed. For congestion    [provider]    Allergies Amoxicillin and Penicillins  No family history on file.  Social History Social History   Tobacco Use   Smoking status: Never   Smokeless tobacco: Never  Substance Use Topics   Alcohol use: No   Drug use: No    Review of Systems  Constitutional: Negative for fever. Eyes: Negative for visual changes. ENT: Negative for sore throat. + Congestion Neck: No neck pain  Cardiovascular: Negative for chest pain. + Chest tightness Respiratory: Negative for shortness of breath. + Wheezing and cough Gastrointestinal: Negative for abdominal pain, vomiting or diarrhea. Genitourinary: Negative for dysuria. Musculoskeletal: Negative for back pain. Skin: Negative for rash. Neurological: Negative for headaches, weakness or numbness. Psych: No SI or  HI  ____________________________________________   PHYSICAL EXAM:  VITAL SIGNS: ED Triage Vitals [12/01/20 2256]  Enc Vitals Group     BP (!) 120/57     Pulse Rate 90     Resp 18     Temp 98 F (36.7 C)     Temp Source Oral     SpO2 100 %     Weight 190 lb (86.2 kg)     Height 5' (1.524 m)     Head Circumference      Peak Flow      Pain Score 8     Pain Loc      Pain Edu?      Excl. in GC?      Constitutional: Alert and oriented. Well appearing and in no apparent distress. HEENT:      Head: Normocephalic and atraumatic.         Eyes: Conjunctivae are normal. Sclera is non-icteric.       Mouth/Throat: Mucous membranes are moist.       Neck: Supple with no signs of meningismus. Cardiovascular: Regular rate and rhythm. No murmurs, gallops, or rubs. 2+ symmetrical distal pulses are present in all extremities. No JVD. Respiratory: Normal respiratory effort.  Diminished air movement with wheezing bilaterally Gastrointestinal: Soft, non tender, and non distended with positive bowel sounds. No rebound or guarding. Genitourinary: No CVA tenderness. Musculoskeletal:  No edema, cyanosis, or erythema of extremities. Neurologic: Normal speech and language. Face is symmetric. Moving all extremities. No gross focal neurologic deficits are appreciated. Skin: Skin is warm, dry and intact. No rash noted. Psychiatric: Mood and affect are normal. Speech and behavior are normal.  ____________________________________________   LABS (all labs ordered are listed, but only abnormal results are displayed)  Labs Reviewed  WET PREP, GENITAL - Abnormal; Notable for the following components:      Result Value   Clue Cells Wet Prep HPF POC PRESENT (*)    WBC, Wet Prep HPF POC MODERATE (*)    All other components within normal limits  CBC - Abnormal; Notable for the following components:   WBC 12.7 (*)    Hemoglobin 11.5 (*)    HCT 35.3 (*)    All other components within normal limits  URINALYSIS, COMPLETE (UACMP) WITH MICROSCOPIC - Abnormal; Notable for the following components:   Color, Urine YELLOW (*)    APPearance HAZY (*)    Leukocytes,Ua TRACE (*)    All other components within normal limits  RESP PANEL BY RT-PCR (FLU A&B, COVID) ARPGX2  CHLAMYDIA/NGC RT PCR ([facility] ONLY)            COMPREHENSIVE METABOLIC PANEL  POC URINE PREG, ED  TROPONIN I (HIGH SENSITIVITY)  TROPONIN I (HIGH  SENSITIVITY)   ____________________________________________  EKG  ED ECG REPORT I, Nita Sickle, the attending physician, personally viewed and interpreted this ECG.  Normal sinus rhythm, rate of 86, normal intervals, normal axis, no ST elevations or depressions.  Normal EKG. ____________________________________________  RADIOLOGY  I have personally reviewed the images performed during this visit and I agree with the Radiologist's read.   Interpretation by Radiologist:  DG Chest 2 View  Result Date: 12/01/2020 CLINICAL DATA:  Chest pain and shortness of breath for several hours, initial encounter EXAM: CHEST - 2 VIEW COMPARISON:  None. FINDINGS: The heart size and mediastinal contours are within normal limits. Both lungs are clear. The visualized skeletal structures are unremarkable. IMPRESSION: No active cardiopulmonary disease. Electronically Signed   By: Loraine Leriche  Lukens M.D.   On: 12/01/2020 23:31     ____________________________________________   PROCEDURES  Procedure(s) performed:yes .1-3 Lead EKG Interpretation  Date/Time: 12/02/2020 5:12 AM Performed by: Nita Sickle, MD Authorized by: Nita Sickle, MD     Interpretation: normal     ECG rate assessment: normal     Rhythm: sinus rhythm     Ectopy: none     Conduction: normal     Critical Care performed:  None ____________________________________________   INITIAL IMPRESSION / ASSESSMENT AND PLAN / ED COURSE  30 y.o. female who presents to the emergency department for 2 complaints.    #Foul vaginal discharge: Patient denies pelvic pain or abdominal pain.  Wet prep positive for BV which patient has had in the past with similar presentation.  She was started on Flagyl.  Chlamydia and gonorrhea pending.  #Chest tightness, cough and wheeze: Most likely bronchitis.  Patient is wheezing and sounds tight on auscultation but has normal work of breathing and normal sats.  No tachypnea, no tachycardia, no  hypoxia.  PERC negative.  Chest x-ray visualized by me with no signs of pneumonia.  COVID and flu negative.  Patient given DuoNeb's and prednisone with resolution of her symptoms.  Will discharge home with albuterol and prednisone.  Recommended returning to the emergency room for new or worsening chest pain, shortness of breath or fever.  Old medical records reviewed.  Patient was monitored on telemetry.    _____________________________________________ Please note:  Patient was evaluated in Emergency Department today for the symptoms described in the history of present illness. Patient was evaluated in the context of the global COVID-19 pandemic, which necessitated consideration that the patient might be at risk for infection with the SARS-CoV-2 virus that causes COVID-19. Institutional protocols and algorithms that pertain to the evaluation of patients at risk for COVID-19 are in a state of rapid change based on information released by regulatory bodies including the CDC and federal and state organizations. These policies and algorithms were followed during the patient's care in the ED.  Some ED evaluations and interventions may be delayed as a result of limited staffing during the pandemic.   [place] Controlled Substance Database was reviewed by me. ____________________________________________   FINAL CLINICAL IMPRESSION(S) / ED DIAGNOSES   Final diagnoses:  Bronchitis  BV (bacterial vaginosis)      NEW MEDICATIONS STARTED DURING THIS VISIT:  ED Discharge Orders          Ordered    albuterol (VENTOLIN HFA) 108 (90 Base) MCG/ACT inhaler  Every 6 hours PRN        12/02/20 0442    predniSONE (DELTASONE) 20 MG tablet  Daily with breakfast        12/02/20 0442    metroNIDAZOLE (FLAGYL) 500 MG tablet  2 times daily        12/02/20 0442             Note:  This document was prepared using Dragon voice recognition software and may include unintentional dictation errors.    Nita Sickle, MD 12/02/20 [phone]

## 2021-01-12 DIAGNOSIS — N898 Other specified noninflammatory disorders of vagina: Secondary | ICD-10-CM | POA: Diagnosis not present

## 2021-01-12 DIAGNOSIS — M545 Low back pain, unspecified: Secondary | ICD-10-CM | POA: Diagnosis not present

## 2021-01-12 DIAGNOSIS — Z8639 Personal history of other endocrine, nutritional and metabolic disease: Secondary | ICD-10-CM | POA: Diagnosis not present

## 2021-01-12 DIAGNOSIS — Z202 Contact with and (suspected) exposure to infections with a predominantly sexual mode of transmission: Secondary | ICD-10-CM | POA: Diagnosis not present

## 2021-01-13 DIAGNOSIS — E039 Hypothyroidism, unspecified: Secondary | ICD-10-CM | POA: Insufficient documentation

## 2021-01-13 DIAGNOSIS — F419 Anxiety disorder, unspecified: Secondary | ICD-10-CM | POA: Insufficient documentation

## 2021-01-13 HISTORY — DX: Anxiety disorder, unspecified: F41.9

## 2021-01-13 NOTE — Progress Notes (Deleted)
There were no vitals taken for this visit.   Subjective:    Patient ID: Sonya Castro, female    DOB: [birth year], 30 y.o.   MRN: [id number]  HPI: Sonya Castro is a 30 y.o. female  No chief complaint on file.  Patient presents to clinic to establish care with new PCP.  Patient reports a history of ***. Patient denies a history of: Hypertension, Elevated Cholesterol, Diabetes, Thyroid problems, Depression, Anxiety, Neurological problems, and Abdominal problems.   Active Ambulatory Problems    Diagnosis Date Noted   Anemia 07/30/2019   Anxiety 01/13/2021   Hypothyroidism 01/13/2021   Resolved Ambulatory Problems    Diagnosis Date Noted   No Resolved Ambulatory Problems   No Additional Past Medical History   No past surgical history on file.  No family history on file.    Review of Systems  Per HPI unless specifically indicated above     Objective:    There were no vitals taken for this visit.  Wt Readings from Last 3 Encounters:  12/01/20 190 lb (86.2 kg)  11/26/20 190 lb (86.2 kg)  08/26/20 172 lb (78 kg)    Physical Exam  Results for orders placed or performed during the hospital encounter of 12/02/20  Resp Panel by RT-PCR (Flu A&B, Covid) Nasopharyngeal Swab   Specimen: Nasopharyngeal Swab; Nasopharyngeal(NP) swabs in vial transport medium  Result Value Ref Range   SARS Coronavirus 2 by RT PCR NEGATIVE NEGATIVE   Influenza A by PCR NEGATIVE NEGATIVE   Influenza B by PCR NEGATIVE NEGATIVE  Wet prep, genital   Specimen: Nasopharyngeal Swab  Result Value Ref Range   Yeast Wet Prep HPF POC NONE SEEN NONE SEEN   Trich, Wet Prep NONE SEEN NONE SEEN   Clue Cells Wet Prep HPF POC PRESENT (A) NONE SEEN   WBC, Wet Prep HPF POC MODERATE (A) NONE SEEN   Sperm NONE SEEN   Chlamydia/NGC rt PCR ([facility] only)   Specimen: Nasopharyngeal Swab  Result Value Ref Range   Specimen source GC/Chlam ENDOCERVICAL    Chlamydia Tr NOT DETECTED NOT DETECTED   N gonorrhoeae NOT  DETECTED NOT DETECTED  CBC  Result Value Ref Range   WBC 12.7 (H) 4.0 - 10.5 K/uL   RBC 4.27 3.87 - 5.11 MIL/uL   Hemoglobin 11.5 (L) 12.0 - 15.0 g/dL   HCT 09.3 (L) 81.8 - 29.9 %   MCV 82.7 80.0 - 100.0 fL   MCH 26.9 26.0 - 34.0 pg   MCHC 32.6 30.0 - 36.0 g/dL   RDW 37.1 69.6 - 78.9 %   Platelets 181 150 - 400 K/uL   nRBC 0.0 0.0 - 0.2 %  Comprehensive metabolic panel  Result Value Ref Range   Sodium 137 135 - 145 mmol/L   Potassium 3.7 3.5 - 5.1 mmol/L   Chloride 104 98 - 111 mmol/L   CO2 26 22 - 32 mmol/L   Glucose, Bld 94 70 - 99 mg/dL   BUN 10 6 - 20 mg/dL   Creatinine, Ser 3.81 0.44 - 1.00 mg/dL   Calcium 9.0 8.9 - 01.7 mg/dL   Total Protein 7.4 6.5 - 8.1 g/dL   Albumin 3.9 3.5 - 5.0 g/dL   AST 16 15 - 41 U/L   ALT 15 0 - 44 U/L   Alkaline Phosphatase 66 38 - 126 U/L   Total Bilirubin 0.6 0.3 - 1.2 mg/dL   GFR, Estimated >51 >02 mL/min   Anion gap 7 5 -  15  Urinalysis, Complete w Microscopic  Result Value Ref Range   Color, Urine YELLOW (A) YELLOW   APPearance HAZY (A) CLEAR   Specific Gravity, Urine 1.023 1.005 - 1.030   pH 7.0 5.0 - 8.0   Glucose, UA NEGATIVE NEGATIVE mg/dL   Hgb urine dipstick NEGATIVE NEGATIVE   Bilirubin Urine NEGATIVE NEGATIVE   Ketones, ur NEGATIVE NEGATIVE mg/dL   Protein, ur NEGATIVE NEGATIVE mg/dL   Nitrite NEGATIVE NEGATIVE   Leukocytes,Ua TRACE (A) NEGATIVE   RBC / HPF 0-5 0 - 5 RBC/hpf   WBC, UA 0-5 0 - 5 WBC/hpf   Bacteria, UA NONE SEEN NONE SEEN   Squamous Epithelial / LPF 6-10 0 - 5   Mucus PRESENT   POC Urine Pregnancy, ED  Result Value Ref Range   Preg Test, Ur NEGATIVE NEGATIVE  Troponin I (High Sensitivity)  Result Value Ref Range   Troponin I (High Sensitivity) <2 <18 ng/L  Troponin I (High Sensitivity)  Result Value Ref Range   Troponin I (High Sensitivity) <2 <18 ng/L      Assessment & Plan:   Problem List Items Addressed This Visit   None Visit Diagnoses     Encounter to establish care    -  Primary         Follow up plan: No follow-ups on file.

## 2021-01-16 ENCOUNTER — Ambulatory Visit: Payer: Medicaid Other | Admitting: Nurse Practitioner

## 2021-01-16 DIAGNOSIS — Z7689 Persons encountering health services in other specified circumstances: Secondary | ICD-10-CM

## 2021-02-04 DIAGNOSIS — R062 Wheezing: Secondary | ICD-10-CM | POA: Diagnosis not present

## 2021-02-04 DIAGNOSIS — Z20822 Contact with and (suspected) exposure to covid-19: Secondary | ICD-10-CM | POA: Diagnosis not present

## 2021-02-04 DIAGNOSIS — J40 Bronchitis, not specified as acute or chronic: Secondary | ICD-10-CM | POA: Diagnosis not present

## 2021-02-04 DIAGNOSIS — J069 Acute upper respiratory infection, unspecified: Secondary | ICD-10-CM | POA: Diagnosis not present

## 2021-02-04 DIAGNOSIS — Z1159 Encounter for screening for other viral diseases: Secondary | ICD-10-CM | POA: Diagnosis not present

## 2021-02-04 DIAGNOSIS — J209 Acute bronchitis, unspecified: Secondary | ICD-10-CM | POA: Diagnosis not present

## 2021-02-05 DIAGNOSIS — R062 Wheezing: Secondary | ICD-10-CM | POA: Diagnosis not present

## 2021-02-05 DIAGNOSIS — R0602 Shortness of breath: Secondary | ICD-10-CM | POA: Diagnosis not present

## 2021-02-05 DIAGNOSIS — Z20822 Contact with and (suspected) exposure to covid-19: Secondary | ICD-10-CM | POA: Diagnosis not present

## 2021-02-05 DIAGNOSIS — R0789 Other chest pain: Secondary | ICD-10-CM | POA: Diagnosis not present

## 2021-02-06 ENCOUNTER — Ambulatory Visit: Payer: Self-pay | Admitting: Internal Medicine

## 2021-02-08 ENCOUNTER — Encounter: Payer: Self-pay | Admitting: Nurse Practitioner

## 2021-02-08 ENCOUNTER — Other Ambulatory Visit: Payer: Self-pay

## 2021-02-08 ENCOUNTER — Ambulatory Visit (INDEPENDENT_AMBULATORY_CARE_PROVIDER_SITE_OTHER): Payer: Medicaid Other | Admitting: Nurse Practitioner

## 2021-02-08 VITALS — BP 114/76 | HR 103 | Temp 98.2°F | Wt 186.0 lb

## 2021-02-08 DIAGNOSIS — R946 Abnormal results of thyroid function studies: Secondary | ICD-10-CM | POA: Diagnosis not present

## 2021-02-08 DIAGNOSIS — Z7689 Persons encountering health services in other specified circumstances: Secondary | ICD-10-CM

## 2021-02-08 DIAGNOSIS — R051 Acute cough: Secondary | ICD-10-CM | POA: Diagnosis not present

## 2021-02-08 DIAGNOSIS — N898 Other specified noninflammatory disorders of vagina: Secondary | ICD-10-CM

## 2021-02-08 MED ORDER — PREDNISONE 10 MG PO TABS: 10.0000 mg | ORAL_TABLET | Freq: Every day | ORAL | 0 refills | Status: DC

## 2021-02-08 NOTE — Progress Notes (Signed)
BP 114/76   Pulse (!) 103   Temp 98.2 F (36.8 C)   Wt 186 lb (84.4 kg)   SpO2 97%   BMI 36.33 kg/m    Subjective:    Patient ID: Sonya Castro, female    DOB: [birth year], 30 y.o.   MRN: [id number]  HPI: Sonya Castro is a 30 y.o. female  Chief Complaint  Patient presents with   Establish Care   Cough    Patient states she has been coughing for about a week and a half. Was seen in ED Saturday, and Sunday was told it was bronchitis. Has questions about asthma.    Vaginal Discharge    Patient states she has vaginal discharge and odor. Was diagnosed with chronic BV in 2018 and lost her provider she was seeing due to her moving.   bladder concern    Patient states she has concerns about bladder incontinence    Back Pain    Patient states she has back pain and shoulder pain, believes it due to her breast size. Would like referral to surgeon.    Patient presents to clinic to establish care with new PCP.  Patient reports a history of thyroid problems.  She thinks she has some anxiety but has never been diagnosed.   Patient denies a history of: Hypertension, Elevated Cholesterol, Diabetes, Depression, Anxiety, Neurological problems, and Abdominal problems.   COUGH Patient states her cough started about a week and a half ago.  She is concerned that she may have asthma due to having two URIs back to back within a month.  Has never been tested for asthma.  She is also having some SOB. She had a chest xray done and it was negative.    VAGINAL DISCHARGE Patient states she has a white discharge and slight odor. She does have a history of BV. It has been difficult to treat with PO flagyl.  GAD 7 : Generalized Anxiety Score 02/08/2021  Nervous, Anxious, on Edge 1  Control/stop worrying 0  Worry too much - different things 0  Trouble relaxing 0  Restless 0  Easily annoyed or irritable 1  Afraid - awful might happen 0  Total GAD 7 Score 2   Flowsheet Row Office Visit from 02/08/2021 in  [facility]  PHQ-9 Total Score 4         Active Ambulatory Problems    Diagnosis Date Noted   Anemia 07/30/2019   Anxiety 01/13/2021   Hypothyroidism 01/13/2021   Resolved Ambulatory Problems    Diagnosis Date Noted   No Resolved Ambulatory Problems   No Additional Past Medical History   History reviewed. No pertinent surgical history. History reviewed. No pertinent family history.  Allergies and medications reviewed and updated.  Review of Systems  Respiratory:  Positive for cough.   Genitourinary:  Positive for vaginal discharge.   Per HPI unless specifically indicated above     Objective:    BP 114/76   Pulse (!) 103   Temp 98.2 F (36.8 C)   Wt 186 lb (84.4 kg)   SpO2 97%   BMI 36.33 kg/m   Wt Readings from Last 3 Encounters:  02/08/21 186 lb (84.4 kg)  12/01/20 190 lb (86.2 kg)  11/26/20 190 lb (86.2 kg)    Physical Exam Vitals and nursing note reviewed.  Constitutional:      General: She is not in acute distress.    Appearance: Normal appearance. She is normal weight. She is not ill-appearing,  toxic-appearing or diaphoretic.  HENT:     Head: Normocephalic.     Right Ear: External ear normal.     Left Ear: External ear normal.     Nose: Nose normal.     Mouth/Throat:     Mouth: Mucous membranes are moist.     Pharynx: Oropharynx is clear.  Eyes:     General:        Right eye: No discharge.        Left eye: No discharge.     Extraocular Movements: Extraocular movements intact.     Conjunctiva/sclera: Conjunctivae normal.     Pupils: Pupils are equal, round, and reactive to light.  Cardiovascular:     Rate and Rhythm: Normal rate and regular rhythm.     Heart sounds: No murmur heard. Pulmonary:     Effort: Pulmonary effort is normal. No respiratory distress.     Breath sounds: Wheezing present. No rales.  Musculoskeletal:     Cervical back: Normal range of motion and neck supple.  Skin:    General: Skin is warm and dry.      Capillary Refill: Capillary refill takes less than 2 seconds.  Neurological:     General: No focal deficit present.     Mental Status: She is alert and oriented to person, place, and time. Mental status is at baseline.  Psychiatric:        Mood and Affect: Mood normal.        Behavior: Behavior normal.        Thought Content: Thought content normal.        Judgment: Judgment normal.    Results for orders placed or performed in visit on 02/08/21  WET PREP FOR TRICH, YEAST, CLUE   Specimen: Sterile Swab   Sterile Swab  Result Value Ref Range   Trichomonas Exam Negative Negative   Yeast Exam Negative Negative   Clue Cell Exam Positive (A) Negative  Thyroid Panel With TSH  Result Value Ref Range   TSH 1.230 0.450 - 4.500 uIU/mL   T4, Total 8.1 4.5 - 12.0 ug/dL   T3 Uptake Ratio 31 24 - 39 %   Free Thyroxine Index 2.5 1.2 - 4.9      Assessment & Plan:   Problem List Items Addressed This Visit   None Visit Diagnoses     Abnormal thyroid function test    -  Primary   Labs ordered to evaluate thyroid function. Will make recommendations based on lab results.    Relevant Orders   Thyroid Panel With TSH (Completed)   Acute cough       Patient has ongoing wheezing and cough. Will treat with streoids. Will do spirometry at next visit.  Follow up in 2 weeks.   Vaginal discharge       Will treat with vaginal flagyl. If BV does not clear will treat with ongoing vaginal flagyl.  Follow up in 2 weeks.   Relevant Orders   WET PREP FOR TRICH, YEAST, CLUE (Completed)   Encounter to establish care            Follow up plan: Return in about 2 weeks (around 02/22/2021) for Back pain and breathing follow up.

## 2021-02-09 LAB — WET PREP FOR TRICH, YEAST, CLUE
Clue Cell Exam: POSITIVE — AB
Trichomonas Exam: NEGATIVE
Yeast Exam: NEGATIVE

## 2021-02-09 LAB — THYROID PANEL WITH TSH
Free Thyroxine Index: 2.5 (ref 1.2–4.9)
T3 Uptake Ratio: 31 % (ref 24–39)
T4, Total: 8.1 ug/dL (ref 4.5–12.0)
TSH: 1.23 u[IU]/mL (ref 0.450–4.500)

## 2021-02-09 MED ORDER — METRONIDAZOLE 0.75 % VA GEL: 1.0000 | Freq: Two times a day (BID) | VAGINAL | 0 refills | Status: AC

## 2021-02-09 NOTE — Progress Notes (Signed)
Please let patient know that she is positive for BV as expected.  I will send in the vaginal flagyl for her.  We will follow up in 2 weeks.

## 2021-02-22 ENCOUNTER — Ambulatory Visit (INDEPENDENT_AMBULATORY_CARE_PROVIDER_SITE_OTHER): Payer: BC Managed Care – PPO | Admitting: Nurse Practitioner

## 2021-02-22 ENCOUNTER — Encounter: Payer: Self-pay | Admitting: Nurse Practitioner

## 2021-02-22 ENCOUNTER — Other Ambulatory Visit: Payer: Self-pay

## 2021-02-22 VITALS — BP 124/54 | HR 100 | Temp 97.8°F | Wt 188.8 lb

## 2021-02-22 DIAGNOSIS — M549 Dorsalgia, unspecified: Secondary | ICD-10-CM | POA: Diagnosis not present

## 2021-02-22 DIAGNOSIS — Z23 Encounter for immunization: Secondary | ICD-10-CM

## 2021-02-22 NOTE — Progress Notes (Signed)
BP (!) 124/54   Pulse 100   Temp 97.8 F (36.6 C) (Oral)   Wt 188 lb 12.8 oz (85.6 kg)   LMP 02/22/2021 (Exact Date)   SpO2 99%   BMI 36.87 kg/m    Subjective:    Patient ID: Sonya Castro, female    DOB: [birth year], 30 y.o.   MRN: [id number]  HPI: Sonya Castro is a 30 y.o. female  Chief Complaint  Patient presents with   Back Pain    2 week f/up   BACK PAIN Duration:  5 years - patient went from a c to a triple D in a year Mechanism of injury: no trauma Location: upper back Onset: gradual Severity: moderate Quality: sharp, dull, and aching- especially when she is giving her kids a bath.   Frequency: constant Radiation: none Aggravating factors: bending Alleviating factors: laying Status: worse Treatments attempted: ibuprofen  Relief with NSAIDs?: no Nighttime pain:  yes Paresthesias / decreased sensation:  yes- in the back of her calfs Bowel / bladder incontinence:  no Fevers:  no Dysuria / urinary frequency:  no    Relevant past medical, surgical, family and social history reviewed and updated as indicated. Interim medical history since our last visit reviewed. Allergies and medications reviewed and updated.  Review of Systems  Musculoskeletal:        Upper back pain   Per HPI unless specifically indicated above     Objective:    BP (!) 124/54   Pulse 100   Temp 97.8 F (36.6 C) (Oral)   Wt 188 lb 12.8 oz (85.6 kg)   LMP 02/22/2021 (Exact Date)   SpO2 99%   BMI 36.87 kg/m   Wt Readings from Last 3 Encounters:  02/22/21 188 lb 12.8 oz (85.6 kg)  02/08/21 186 lb (84.4 kg)  12/01/20 190 lb (86.2 kg)    Physical Exam Vitals and nursing note reviewed.  Constitutional:      General: She is not in acute distress.    Appearance: Normal appearance. She is normal weight. She is not ill-appearing, toxic-appearing or diaphoretic.  HENT:     Head: Normocephalic.     Right Ear: External ear normal.     Left Ear: External ear normal.     Nose: Nose  normal.     Mouth/Throat:     Mouth: Mucous membranes are moist.     Pharynx: Oropharynx is clear.  Eyes:     General:        Right eye: No discharge.        Left eye: No discharge.     Extraocular Movements: Extraocular movements intact.     Conjunctiva/sclera: Conjunctivae normal.     Pupils: Pupils are equal, round, and reactive to light.  Cardiovascular:     Rate and Rhythm: Normal rate and regular rhythm.     Heart sounds: No murmur heard. Pulmonary:     Effort: Pulmonary effort is normal. No respiratory distress.     Breath sounds: Normal breath sounds. No wheezing or rales.  Musculoskeletal:     Cervical back: Normal range of motion and neck supple.     Comments: Indention's in her shoulder from bra strap  Skin:    General: Skin is warm and dry.     Capillary Refill: Capillary refill takes less than 2 seconds.  Neurological:     General: No focal deficit present.     Mental Status: She is alert and oriented to person, place, and time.  Mental status is at baseline.  Psychiatric:        Mood and Affect: Mood normal.        Behavior: Behavior normal.        Thought Content: Thought content normal.        Judgment: Judgment normal.    Results for orders placed or performed in visit on 02/08/21  WET PREP FOR TRICH, YEAST, CLUE   Specimen: Sterile Swab   Sterile Swab  Result Value Ref Range   Trichomonas Exam Negative Negative   Yeast Exam Negative Negative   Clue Cell Exam Positive (A) Negative  Thyroid Panel With TSH  Result Value Ref Range   TSH 1.230 0.450 - 4.500 uIU/mL   T4, Total 8.1 4.5 - 12.0 ug/dL   T3 Uptake Ratio 31 24 - 39 %   Free Thyroxine Index 2.5 1.2 - 4.9      Assessment & Plan:   Problem List Items Addressed This Visit   None Visit Diagnoses     Upper back pain    -  Primary   Will send to physical therapy to help with pain while working on possible breast reduction. Will send patient for evaluation and possible breast reduction.    Relevant Orders   Ambulatory referral to Physical Therapy   Need for influenza vaccination       Relevant Orders   Flu Vaccine QUAD 23mo+IM (Fluarix, Fluzone & Alfiuria Quad PF) (Completed)        Follow up plan: Return in about 3 weeks (around 03/15/2021) for BV follow up.

## 2021-03-09 ENCOUNTER — Ambulatory Visit: Payer: BC Managed Care – PPO | Attending: Nurse Practitioner | Admitting: Physical Therapy

## 2021-03-09 DIAGNOSIS — O009 Unspecified ectopic pregnancy without intrauterine pregnancy: Secondary | ICD-10-CM

## 2021-03-09 DIAGNOSIS — N62 Hypertrophy of breast: Secondary | ICD-10-CM | POA: Diagnosis not present

## 2021-03-09 HISTORY — DX: Unspecified ectopic pregnancy without intrauterine pregnancy: O00.90

## 2021-03-15 ENCOUNTER — Ambulatory Visit (INDEPENDENT_AMBULATORY_CARE_PROVIDER_SITE_OTHER): Payer: BC Managed Care – PPO | Admitting: Nurse Practitioner

## 2021-03-15 ENCOUNTER — Other Ambulatory Visit: Payer: Self-pay

## 2021-03-15 ENCOUNTER — Encounter: Payer: Self-pay | Admitting: Nurse Practitioner

## 2021-03-15 DIAGNOSIS — B9689 Other specified bacterial agents as the cause of diseases classified elsewhere: Secondary | ICD-10-CM | POA: Diagnosis not present

## 2021-03-15 DIAGNOSIS — N760 Acute vaginitis: Secondary | ICD-10-CM | POA: Diagnosis not present

## 2021-03-15 DIAGNOSIS — N898 Other specified noninflammatory disorders of vagina: Secondary | ICD-10-CM | POA: Diagnosis not present

## 2021-03-15 LAB — WET PREP FOR TRICH, YEAST, CLUE
Clue Cell Exam: POSITIVE — AB
Trichomonas Exam: NEGATIVE
Yeast Exam: NEGATIVE

## 2021-03-15 MED ORDER — ALBUTEROL SULFATE HFA 108 (90 BASE) MCG/ACT IN AERS: 2.0000 | INHALATION_SPRAY | Freq: Four times a day (QID) | RESPIRATORY_TRACT | 2 refills | Status: DC | PRN

## 2021-03-15 MED ORDER — METRONIDAZOLE 0.75 % VA GEL: 1.0000 | VAGINAL | 2 refills | Status: DC

## 2021-03-15 NOTE — Progress Notes (Signed)
Results discussed with patient during visit.

## 2021-03-15 NOTE — Progress Notes (Signed)
BP 106/73   Pulse 80   Temp 98.5 F (36.9 C) (Oral)   Ht 5' (1.524 m)   Wt 192 lb 9.6 oz (87.4 kg)   LMP 02/22/2021 (Exact Date)   SpO2 99%   BMI 37.61 kg/m    Subjective:    Patient ID: Sonya Castro, female    DOB: 01-25-1991, 30 y.o.   MRN: [id number]  HPI: Sonya Castro is a 30 y.o. female  Chief Complaint  Patient presents with   Bacterial Vaginosis    Patient was just recently treated for and still having symptoms.     Patient is here to follow up in BV.  She has been treated with oral and vaginal flagyl and clindamycin.  She still tested positive in office today.  After failing multiple treatments she is looking for a plan to help with the BV long term.   Patient needs a refill on her albuterol.  She is also interested in weight loss.  She had a consultation for a breast reduction and they recommended that she see a dietician to help with weight loss.  Relevant past medical, surgical, family and social history reviewed and updated as indicated. Interim medical history since our last visit reviewed. Allergies and medications reviewed and updated.  Review of Systems  Genitourinary:  Positive for vaginal discharge.       Foul odor   Per HPI unless specifically indicated above     Objective:    BP 106/73   Pulse 80   Temp 98.5 F (36.9 C) (Oral)   Ht 5' (1.524 m)   Wt 192 lb 9.6 oz (87.4 kg)   LMP 02/22/2021 (Exact Date)   SpO2 99%   BMI 37.61 kg/m   Wt Readings from Last 3 Encounters:  03/15/21 192 lb 9.6 oz (87.4 kg)  02/22/21 188 lb 12.8 oz (85.6 kg)  02/08/21 186 lb (84.4 kg)    Physical Exam Vitals and nursing note reviewed.  Constitutional:      General: She is not in acute distress.    Appearance: Normal appearance. She is normal weight. She is not ill-appearing, toxic-appearing or diaphoretic.  HENT:     Head: Normocephalic.     Right Ear: External ear normal.     Left Ear: External ear normal.     Nose: Nose normal.     Mouth/Throat:      Mouth: Mucous membranes are moist.     Pharynx: Oropharynx is clear.  Eyes:     General:        Right eye: No discharge.        Left eye: No discharge.     Extraocular Movements: Extraocular movements intact.     Conjunctiva/sclera: Conjunctivae normal.     Pupils: Pupils are equal, round, and reactive to light.  Cardiovascular:     Rate and Rhythm: Normal rate and regular rhythm.     Heart sounds: No murmur heard. Pulmonary:     Effort: Pulmonary effort is normal. No respiratory distress.     Breath sounds: Normal breath sounds. No wheezing or rales.  Musculoskeletal:     Cervical back: Normal range of motion and neck supple.  Skin:    General: Skin is warm and dry.     Capillary Refill: Capillary refill takes less than 2 seconds.  Neurological:     General: No focal deficit present.     Mental Status: She is alert and oriented to person, place, and time. Mental status is  at baseline.  Psychiatric:        Mood and Affect: Mood normal.        Behavior: Behavior normal.        Thought Content: Thought content normal.        Judgment: Judgment normal.    Results for orders placed or performed in visit on 02/08/21  WET PREP FOR TRICH, YEAST, CLUE   Specimen: Sterile Swab   Sterile Swab  Result Value Ref Range   Trichomonas Exam Negative Negative   Yeast Exam Negative Negative   Clue Cell Exam Positive (A) Negative  Thyroid Panel With TSH  Result Value Ref Range   TSH 1.230 0.450 - 4.500 uIU/mL   T4, Total 8.1 4.5 - 12.0 ug/dL   T3 Uptake Ratio 31 24 - 39 %   Free Thyroxine Index 2.5 1.2 - 4.9      Assessment & Plan:   Problem List Items Addressed This Visit   None Visit Diagnoses     Morbid obesity (HCC)    -  Primary   Referral placed for patient to see nutrition to help with weight loss. Goal is for patient to have breast reduction done to help with back pain.    Relevant Orders   Amb Referral to Nutrition and Diabetic Education   Bacterial vaginosis       pos  for clue cells in office. Treat with vaginal Flagyl two times weekly for 6 months. Will retest patient at that time, if still positive will continue flagyl.   Vaginal discharge       Relevant Orders   WET PREP FOR TRICH, YEAST, CLUE        Follow up plan: Return in about 6 months (around 09/13/2021) for BV.

## 2021-03-16 ENCOUNTER — Ambulatory Visit: Payer: BC Managed Care – PPO | Admitting: Physical Therapy

## 2021-03-21 ENCOUNTER — Encounter: Payer: Medicaid Other | Admitting: Physical Therapy

## 2021-03-22 ENCOUNTER — Encounter: Payer: Medicaid Other | Admitting: Physical Therapy

## 2021-03-24 DIAGNOSIS — Z3201 Encounter for pregnancy test, result positive: Secondary | ICD-10-CM | POA: Diagnosis not present

## 2021-03-24 DIAGNOSIS — Z3200 Encounter for pregnancy test, result unknown: Secondary | ICD-10-CM | POA: Diagnosis not present

## 2021-03-28 ENCOUNTER — Encounter: Payer: Medicaid Other | Admitting: Physical Therapy

## 2021-03-29 DIAGNOSIS — Z3201 Encounter for pregnancy test, result positive: Secondary | ICD-10-CM | POA: Diagnosis not present

## 2021-03-30 ENCOUNTER — Ambulatory Visit: Payer: Self-pay

## 2021-03-30 ENCOUNTER — Encounter: Payer: Self-pay | Admitting: Nurse Practitioner

## 2021-03-30 ENCOUNTER — Encounter: Payer: Medicaid Other | Admitting: Physical Therapy

## 2021-03-30 ENCOUNTER — Other Ambulatory Visit: Payer: Self-pay

## 2021-03-30 ENCOUNTER — Ambulatory Visit (INDEPENDENT_AMBULATORY_CARE_PROVIDER_SITE_OTHER): Payer: BC Managed Care – PPO | Admitting: Nurse Practitioner

## 2021-03-30 VITALS — BP 104/69 | HR 92 | Temp 98.4°F | Wt 191.0 lb

## 2021-03-30 DIAGNOSIS — Z3201 Encounter for pregnancy test, result positive: Secondary | ICD-10-CM | POA: Diagnosis not present

## 2021-03-30 NOTE — Telephone Encounter (Signed)
° °  Chief Complaint: Mild vaginal spotting and mild cramping Symptoms: Yes Frequency: Started yesterday Pertinent Negatives: Patient denies Severe pain Disposition: [] ED /[] Urgent Care (no appt availability in office) / [] Appointment(In office/virtual)/ []  [facility]/ [] Home Care/ [x] Refused Recommended Disposition  Additional Notes: Pt. Reports she is [redacted] weeks pregnant. Started having mild abdominal cramping and vaginal spotting yesterday. States has an OG/GYN office but "I don't like them and I want a new OB/GYN. I need someone that takes Medicaid." Refuses to call OB/GYN today. Requests to see PCP today and get help with finding a new OB. Did not want to hold for FC. No answer on FC line.Please advise pt.    Reason for Disposition  MILD vaginal bleeding (e.g., < 1 pad / hour) or SPOTTING  Answer Assessment - Initial Assessment Questions 1. LOCATION: "Where does it hurt?"      Low abdomen 2. RADIATION: "Does the pain shoot anywhere else?" (e.g., chest, back, shoulder)     No 3. ONSET: "When did the pain begin?" (e.g., minutes, hours or days ago)      Yesterday 4. ONSET: "Gradual or sudden onset?"     Gradual 5. PATTERN "Does the pain come and go, or has it been constant since it started?"      Today - comes and goes 6. SEVERITY: "How bad is the pain?" "What does it keep you from doing?"  (e.g., Scale 1-10; mild, moderate, or severe)   - MILD (1-3): doesn't interfere with normal activities, abdomen soft and not tender to touch    - MODERATE (4-7): interferes with normal activities or awakens from sleep, abdomen tender to touch    - SEVERE (8-10): excruciating pain, doubled over, unable to do any normal activities     Mild 7. RECURRENT SYMPTOM: "Have you ever had this type of stomach pain before?" If Yes, ask: "When was the last time?" and "What happened that time?"      No 8. CAUSE: "What do you think is causing the stomach pain?"     Unsure 9. RELIEVING/AGGRAVATING  FACTORS: "What makes it better or worse?" (e.g., antacids, bowel movement, movement)     No 10. OTHER SYMPTOMS:"Do you have any other symptoms?" (e.g., back pain, diarrhea, fever, urination pain, vaginal discharge, vomiting)       Legs cramping 11. EDD: "What date are you expecting to deliver?"       Unsure - [redacted] weeks pregnant  Protocols used: Pregnancy - Abdominal Pain Less Than [redacted] Weeks EGA-A-AH

## 2021-03-30 NOTE — Progress Notes (Signed)
BP 104/69    Pulse 92    Temp 98.4 F (36.9 C) (Oral)    Wt 191 lb (86.6 kg)    LMP 02/22/2021 (Exact Date)    SpO2 98%    BMI 37.30 kg/m    Subjective:    Patient ID: Sonya Castro, female    DOB: 1991/03/19, 30 y.o.   MRN: [id number]  HPI: Sonya Castro is a 30 y.o. female  Chief Complaint  Patient presents with   Vaginal Bleeding    Pt states she recently found out that she was pregnant and she has noticed some blood when wiping lately. States her last cycle was 02/22/21. States she went to [facility] and had blood work, states she is still very early in her pregnancy.    Pt states she recently found out that she was pregnant and she has noticed some blood when wiping lately. States her last cycle was 02/22/21. States she went to [facility] and had blood work, states she is still very early in her pregnancy.  Patient states she is cramping in her abdomen and leg cramps.  She is only spotting and having blood when she wipes.   Relevant past medical, surgical, family and social history reviewed and updated as indicated. Interim medical history since our last visit reviewed. Allergies and medications reviewed and updated.  Review of Systems  Gastrointestinal:  Positive for abdominal pain.  Genitourinary:  Positive for vaginal bleeding.   Per HPI unless specifically indicated above     Objective:    BP 104/69    Pulse 92    Temp 98.4 F (36.9 C) (Oral)    Wt 191 lb (86.6 kg)    LMP 02/22/2021 (Exact Date)    SpO2 98%    BMI 37.30 kg/m   Wt Readings from Last 3 Encounters:  03/30/21 191 lb (86.6 kg)  03/15/21 192 lb 9.6 oz (87.4 kg)  02/22/21 188 lb 12.8 oz (85.6 kg)    Physical Exam Vitals and nursing note reviewed.  Constitutional:      General: She is not in acute distress.    Appearance: Normal appearance. She is normal weight. She is not ill-appearing, toxic-appearing or diaphoretic.  HENT:     Head: Normocephalic.     Right Ear: External ear normal.     Left Ear: External ear  normal.     Nose: Nose normal.     Mouth/Throat:     Mouth: Mucous membranes are moist.     Pharynx: Oropharynx is clear.  Eyes:     General:        Right eye: No discharge.        Left eye: No discharge.     Extraocular Movements: Extraocular movements intact.     Conjunctiva/sclera: Conjunctivae normal.     Pupils: Pupils are equal, round, and reactive to light.  Cardiovascular:     Rate and Rhythm: Normal rate and regular rhythm.     Heart sounds: No murmur heard. Pulmonary:     Effort: Pulmonary effort is normal. No respiratory distress.     Breath sounds: Normal breath sounds. No wheezing or rales.  Musculoskeletal:     Cervical back: Normal range of motion and neck supple.  Skin:    General: Skin is warm and dry.     Capillary Refill: Capillary refill takes less than 2 seconds.  Neurological:     General: No focal deficit present.     Mental Status: She is alert and  oriented to person, place, and time. Mental status is at baseline.  Psychiatric:        Mood and Affect: Mood normal.        Behavior: Behavior normal.        Thought Content: Thought content normal.        Judgment: Judgment normal.    Results for orders placed or performed in visit on 03/15/21  WET PREP FOR TRICH, YEAST, CLUE   Specimen: Sterile Swab   Sterile Swab  Result Value Ref Range   Trichomonas Exam Negative Negative   Yeast Exam Negative Negative   Clue Cell Exam Positive (A) Negative      Assessment & Plan:   Problem List Items Addressed This Visit   None Visit Diagnoses     Positive pregnancy test    -  Primary   Patient had a positive pregnancy test. Has not been able to find a GYN. Information given for patient at visit to see GYN. Discussed first trimester spotting.        Follow up plan: Return if symptoms worsen or fail to improve.

## 2021-03-30 NOTE — Patient Instructions (Addendum)
[facility] [phone]  [place] [phone]

## 2021-04-02 ENCOUNTER — Emergency Department
Admission: EM | Admit: 2021-04-02 | Discharge: 2021-04-02 | Disposition: A | Discharge status: Home / Self Care | Payer: BC Managed Care – PPO | Attending: Emergency Medicine | Admitting: Emergency Medicine

## 2021-04-02 ENCOUNTER — Encounter: Payer: Self-pay | Admitting: Emergency Medicine

## 2021-04-02 ENCOUNTER — Other Ambulatory Visit: Payer: Self-pay

## 2021-04-02 ENCOUNTER — Emergency Department: Payer: BC Managed Care – PPO

## 2021-04-02 DIAGNOSIS — O99511 Diseases of the respiratory system complicating pregnancy, first trimester: Secondary | ICD-10-CM | POA: Diagnosis not present

## 2021-04-02 DIAGNOSIS — Z3A00 Weeks of gestation of pregnancy not specified: Secondary | ICD-10-CM | POA: Diagnosis not present

## 2021-04-02 DIAGNOSIS — J45901 Unspecified asthma with (acute) exacerbation: Secondary | ICD-10-CM | POA: Diagnosis not present

## 2021-04-02 DIAGNOSIS — O200 Threatened abortion: Secondary | ICD-10-CM | POA: Diagnosis not present

## 2021-04-02 DIAGNOSIS — N939 Abnormal uterine and vaginal bleeding, unspecified: Secondary | ICD-10-CM | POA: Diagnosis not present

## 2021-04-02 DIAGNOSIS — Z3A01 Less than 8 weeks gestation of pregnancy: Secondary | ICD-10-CM | POA: Diagnosis not present

## 2021-04-02 DIAGNOSIS — Z20822 Contact with and (suspected) exposure to covid-19: Secondary | ICD-10-CM | POA: Diagnosis not present

## 2021-04-02 DIAGNOSIS — O26891 Other specified pregnancy related conditions, first trimester: Secondary | ICD-10-CM | POA: Diagnosis not present

## 2021-04-02 DIAGNOSIS — E039 Hypothyroidism, unspecified: Secondary | ICD-10-CM | POA: Diagnosis not present

## 2021-04-02 DIAGNOSIS — J4521 Mild intermittent asthma with (acute) exacerbation: Secondary | ICD-10-CM | POA: Diagnosis not present

## 2021-04-02 DIAGNOSIS — R000 Tachycardia, unspecified: Secondary | ICD-10-CM | POA: Diagnosis not present

## 2021-04-02 DIAGNOSIS — R109 Unspecified abdominal pain: Secondary | ICD-10-CM

## 2021-04-02 DIAGNOSIS — O209 Hemorrhage in early pregnancy, unspecified: Secondary | ICD-10-CM | POA: Diagnosis not present

## 2021-04-02 HISTORY — DX: Hypothyroidism, unspecified: E03.9

## 2021-04-02 LAB — URINALYSIS, ROUTINE W REFLEX MICROSCOPIC
Bilirubin Urine: NEGATIVE
Glucose, UA: NEGATIVE mg/dL
Ketones, ur: NEGATIVE mg/dL
Nitrite: NEGATIVE
Protein, ur: 30 mg/dL — AB
Specific Gravity, Urine: 1.02 (ref 1.005–1.030)
pH: 6.5 (ref 5.0–8.0)

## 2021-04-02 LAB — CBC
HCT: 35.5 % — ABNORMAL LOW (ref 36.0–46.0)
Hemoglobin: 11.6 g/dL — ABNORMAL LOW (ref 12.0–15.0)
MCH: 26.8 pg (ref 26.0–34.0)
MCHC: 32.7 g/dL (ref 30.0–36.0)
MCV: 82 fL (ref 80.0–100.0)
Platelets: 283 10*3/uL (ref 150–400)
RBC: 4.33 MIL/uL (ref 3.87–5.11)
RDW: 14.3 % (ref 11.5–15.5)
WBC: 9.7 10*3/uL (ref 4.0–10.5)
nRBC: 0 % (ref 0.0–0.2)

## 2021-04-02 LAB — COMPREHENSIVE METABOLIC PANEL
ALT: 14 U/L (ref 0–44)
AST: 16 U/L (ref 15–41)
Albumin: 4.1 g/dL (ref 3.5–5.0)
Alkaline Phosphatase: 72 U/L (ref 38–126)
Anion gap: 7 (ref 5–15)
BUN: 10 mg/dL (ref 6–20)
CO2: 23 mmol/L (ref 22–32)
Calcium: 8.8 mg/dL — ABNORMAL LOW (ref 8.9–10.3)
Chloride: 106 mmol/L (ref 98–111)
Creatinine, Ser: 0.62 mg/dL (ref 0.44–1.00)
GFR, Estimated: >60 mL/min (ref >60)
Glucose, Bld: 105 mg/dL — ABNORMAL HIGH (ref 70–99)
Potassium: 3.6 mmol/L (ref 3.5–5.1)
Sodium: 136 mmol/L (ref 135–145)
Total Bilirubin: 0.6 mg/dL (ref 0.3–1.2)
Total Protein: 7.6 g/dL (ref 6.5–8.1)

## 2021-04-02 LAB — URINALYSIS, MICROSCOPIC (REFLEX): RBC / HPF: >50 RBC/hpf (ref 0–5)

## 2021-04-02 LAB — POC URINE PREG, ED: Preg Test, Ur: POSITIVE — AB

## 2021-04-02 LAB — HCG, QUANTITATIVE, PREGNANCY: hCG, Beta Chain, Quant, S: 44 m[IU]/mL — ABNORMAL HIGH (ref <5)

## 2021-04-02 LAB — RESP PANEL BY RT-PCR (FLU A&B, COVID) ARPGX2
Influenza A by PCR: NEGATIVE
Influenza B by PCR: NEGATIVE
SARS Coronavirus 2 by RT PCR: NEGATIVE

## 2021-04-02 LAB — LIPASE, BLOOD: Lipase: 22 U/L (ref 11–51)

## 2021-04-02 MED ORDER — METHYLPREDNISOLONE SODIUM SUCC 125 MG IJ SOLR
125.0000 mg | Freq: Once | INTRAMUSCULAR | Status: AC
  Administered 2021-04-02: 14:00:00 125 mg via INTRAVENOUS
  Filled 2021-04-02: qty 2

## 2021-04-02 MED ORDER — ALBUTEROL SULFATE (2.5 MG/3ML) 0.083% IN NEBU
5.0000 mg | INHALATION_SOLUTION | Freq: Once | RESPIRATORY_TRACT | Status: AC
  Administered 2021-04-02: 12:00:00 5 mg via RESPIRATORY_TRACT
  Filled 2021-04-02: qty 6

## 2021-04-02 MED ORDER — IPRATROPIUM-ALBUTEROL 0.5-2.5 (3) MG/3ML IN SOLN: 3.0000 mL | Freq: Once | RESPIRATORY_TRACT | Status: DC

## 2021-04-02 MED ORDER — LACTATED RINGERS IV BOLUS
1000.0000 mL | Freq: Once | INTRAVENOUS | Status: AC
  Administered 2021-04-02: 12:00:00 1000 mL via INTRAVENOUS

## 2021-04-02 MED ORDER — ALBUTEROL SULFATE (2.5 MG/3ML) 0.083% IN NEBU
2.5000 mg | INHALATION_SOLUTION | Freq: Once | RESPIRATORY_TRACT | Status: AC
  Administered 2021-04-02: 14:00:00 2.5 mg via RESPIRATORY_TRACT
  Filled 2021-04-02: qty 3

## 2021-04-02 MED ORDER — ALBUTEROL SULFATE HFA 108 (90 BASE) MCG/ACT IN AERS: 2.0000 | INHALATION_SPRAY | RESPIRATORY_TRACT | 0 refills | Status: DC | PRN

## 2021-04-02 MED ORDER — PREDNISONE 20 MG PO TABS: 40.0000 mg | ORAL_TABLET | Freq: Every day | ORAL | 0 refills | Status: AC

## 2021-04-02 NOTE — Discharge Instructions (Signed)
Take the prednisone for your breathing and your steroids  For your bleeding, like we discussed my primary concern at this is a miscarriage.   -It is important to follow-up with OB in the next 48 to 72 hours for repeat lab work and assessment, as there is still a small chance that it could be a pregnancy in a tube/ectopic pregnancy -I would expect the bleeding and cramping to persist for no more than 1 week. -You can take over-the-counter Tylenol 1000 mg every 6 hours for pain.

## 2021-04-02 NOTE — ED Notes (Signed)
Pt to ED for vaginal bleeding and cramping since 3 days, pt states found out was pregnant [redacted] weeks ago. LNMP was 02/22/21. Pt also has pain on deep inspiration, mid chest. States has had bronchitis a few times this year, no asthma diagnosis but is concerned because believes has been wheezing last several days.

## 2021-04-02 NOTE — ED Provider Notes (Signed)
[facility] Emergency Department Provider Note  ____________________________________________   Event Date/Time   First MD Initiated Contact with Patient 04/02/21 1043     (approximate)  I have reviewed the triage vital signs and the nursing notes.   HISTORY  Chief Complaint Shortness of Breath, Vaginal Bleeding, and Abdominal Pain    HPI Sonya Castro is a 30 y.o. female here with multiple complaints.  Patient's primary complaint is shortness of breath.  The patient states that for the last several days, she has had progressively worsening shortness of breath and wheezing.  She has a history of asthma.  She is been using her inhaler every few hours with minimal relief.  She does have sick contacts in her 81-year-old who is in daycare and has a stuffy nose.  No significant chest pain.  No leg swelling.  No history of DVT or PE.  Patient also reports that she is an estimated 4 to [redacted] weeks pregnant.  She has been having some light vaginal spotting that picked up over the last several days.  She was seen by gynecologist and had increasing hCGs and was told to follow-up.  She states that since then, she had 1 day of fairly heavy bleeding that has lightened up but she continues to have some spotting.  No significant dysuria.  No flank pain.  She is G2 P1.  No history of ectopics.    Past Medical History:  Diagnosis Date   Hypothyroidism     Patient Active Problem List   Diagnosis Date Noted   Anxiety 01/13/2021   Hypothyroidism 01/13/2021   Anemia 07/30/2019    No past surgical history on file.  Prior to Admission medications   Medication Sig Start Date End Date Taking? Authorizing Provider  albuterol (VENTOLIN HFA) 108 (90 Base) MCG/ACT inhaler Inhale 2 puffs into the lungs every 4 (four) hours as needed for wheezing or shortness of breath. 04/02/21  Yes Shaune Pollack, MD  predniSONE (DELTASONE) 20 MG tablet Take 2 tablets (40 mg total) by mouth daily with  breakfast for 5 days. 04/02/21 04/07/21 Yes Shaune Pollack, MD  albuterol (VENTOLIN HFA) 108 (90 Base) MCG/ACT inhaler Inhale 2 puffs into the lungs every 6 (six) hours as needed for wheezing or shortness of breath. 03/15/21   Larae Grooms, NP  metroNIDAZOLE (METROGEL VAGINAL) 0.75 % vaginal gel Place 1 Applicatorful vaginally 2 (two) times a week. 2 x weekly for 6 months 03/16/21   Larae Grooms, NP    Allergies Amoxicillin and Penicillins  No family history on file.  Social History Social History   Tobacco Use   Smoking status: Never   Smokeless tobacco: Never  Vaping Use   Vaping Use: Former  Substance Use Topics   Alcohol use: No   Drug use: No    Review of Systems  Review of Systems  Constitutional:  Negative for chills and fever.  HENT:  Negative for sore throat.   Respiratory:  Positive for cough, shortness of breath and wheezing.   Cardiovascular:  Negative for chest pain.  Gastrointestinal:  Positive for abdominal pain.  Genitourinary:  Positive for vaginal bleeding. Negative for flank pain.  Musculoskeletal:  Negative for neck pain.  Skin:  Negative for rash and wound.  Allergic/Immunologic: Negative for immunocompromised state.  Neurological:  Negative for weakness and numbness.  Hematological:  Does not bruise/bleed easily.  All other systems reviewed and are negative.   ____________________________________________  PHYSICAL EXAM:      VITAL SIGNS:  ED Triage Vitals  Enc Vitals Group     BP 04/02/21 0942 125/89     Pulse Rate 04/02/21 0942 (!) 105     Resp 04/02/21 0942 18     Temp 04/02/21 0942 98.4 F (36.9 C)     Temp Source 04/02/21 0942 Oral     SpO2 04/02/21 0942 96 %     Weight 04/02/21 0942 191 lb (86.6 kg)     Height 04/02/21 0942 5' (1.524 m)     Head Circumference --      Peak Flow --      Pain Score 04/02/21 0946 7     Pain Loc --      Pain Edu? --      Excl. in GC? --      Physical Exam Vitals and nursing note reviewed.   Constitutional:      General: She is not in acute distress.    Appearance: She is well-developed.  HENT:     Head: Normocephalic and atraumatic.  Eyes:     Conjunctiva/sclera: Conjunctivae normal.  Cardiovascular:     Rate and Rhythm: Normal rate and regular rhythm.     Heart sounds: Normal heart sounds.  Pulmonary:     Effort: Pulmonary effort is normal. No respiratory distress.     Breath sounds: Wheezing present.     Comments: Mild diffuse expiratory wheezes, normal work of breathing Abdominal:     General: There is no distension.     Comments: No overt abd TTP or guarding. No rebound.  Musculoskeletal:     Cervical back: Neck supple.  Skin:    General: Skin is warm.     Capillary Refill: Capillary refill takes less than 2 seconds.     Findings: No rash.  Neurological:     Mental Status: She is alert and oriented to person, place, and time.     Motor: No abnormal muscle tone.      ____________________________________________   LABS (all labs ordered are listed, but only abnormal results are displayed)  Labs Reviewed  COMPREHENSIVE METABOLIC PANEL - Abnormal; Notable for the following components:      Result Value   Glucose, Bld 105 (*)    Calcium 8.8 (*)    All other components within normal limits  CBC - Abnormal; Notable for the following components:   Hemoglobin 11.6 (*)    HCT 35.5 (*)    All other components within normal limits  URINALYSIS, ROUTINE W REFLEX MICROSCOPIC - Abnormal; Notable for the following components:   Color, Urine AMBER (*)    Hgb urine dipstick LARGE (*)    Protein, ur 30 (*)    Leukocytes,Ua TRACE (*)    All other components within normal limits  HCG, QUANTITATIVE, PREGNANCY - Abnormal; Notable for the following components:   hCG, Beta Chain, Quant, S 44 (*)    All other components within normal limits  URINALYSIS, MICROSCOPIC (REFLEX) - Abnormal; Notable for the following components:   Bacteria, UA FEW (*)    All other components  within normal limits  POC URINE PREG, ED - Abnormal; Notable for the following components:   Preg Test, Ur POSITIVE (*)    All other components within normal limits  RESP PANEL BY RT-PCR (FLU A&B, COVID) ARPGX2  LIPASE, BLOOD    ____________________________________________  EKG: Sinus tachycardia, VR 103. PR 148, QRS 82, QTc 463. No acute ST elevations or depression. No ischemia or infarct. ________________________________________  RADIOLOGY All imaging, including  plain films, CT scans, and ultrasounds, independently reviewed by me, and interpretations confirmed via formal radiology reads.  ED MD interpretation:   US OB: No IUP, no findings suspicious for ectopic  Official radiology report(s): US OB LESS THAN 14 WEEKS WITH OB TRANSVAGINAL  Result Date: 04/02/2021 CLINICAL DATA:  Vaginal bleeding.  Quantitative beta HCG of 44 EXAM: OBSTETRIC <14 WK [place] AND TRANSVAGINAL OB US TECHNIQUE: Both transabdominal and transvaginal ultrasound examinations were performed for complete evaluation of the gestation as well as the maternal uterus, adnexal regions, and pelvic cul-de-sac. Transvaginal technique was performed to assess early pregnancy. COMPARISON:  None. FINDINGS: Intrauterine gestational sac: None Yolk sac:  Not Visualized. Embryo:  Not Visualized. Maternal uterus/adnexae: Anteverted uterus. No evidence of an intrauterine gestational sac. Right ovary measures 3.3 x 1.5 x 1.8 cm and appears unremarkable. Left ovary measures 3.5 x 2.2 x 2.2 cm and appears unremarkable. No adnexal mass identified. Small volume free fluid within the cul-de-sac. IMPRESSION: No intrauterine pregnancy or findings suspicious for ectopic pregnancy. Findings are consistent with pregnancy of unknown location and may reflect early intrauterine pregnancy not yet visualized sonographically, occult ectopic pregnancy, or failed pregnancy. Recommend trending of beta HCG as well as follow-up ultrasound in 7-10 days based on  clinical course. Electronically Signed   By: Duanne Guess D.O.   On: 04/02/2021 13:04    ____________________________________________  PROCEDURES   Procedure(s) performed (including Critical Care):  Procedures  ____________________________________________  INITIAL IMPRESSION / MDM / ASSESSMENT AND PLAN / ED COURSE  As part of my medical decision making, I reviewed the following data within the electronic MEDICAL RECORD NUMBER Nursing notes reviewed and incorporated, Old chart reviewed, Notes from prior ED visits, and [place] Controlled Substance Database       *Shiva Karis was evaluated in Emergency Department on 04/02/2021 for the symptoms described in the history of present illness. She was evaluated in the context of the global COVID-19 pandemic, which necessitated consideration that the patient might be at risk for infection with the SARS-CoV-2 virus that causes COVID-19. Institutional protocols and algorithms that pertain to the evaluation of patients at risk for COVID-19 are in a state of rapid change based on information released by regulatory bodies including the CDC and federal and state organizations. These policies and algorithms were followed during the patient's care in the ED.  Some ED evaluations and interventions may be delayed as a result of limited staffing during the pandemic.*     Medical Decision Making:  30 yo F here with vaginal bleeding, spotting, mild SOB.  Re: vaginal bleeding - unfortunately, suspect spontaneous miscarriage. Pt had HCG>100 per her report and now is at 44, with no signs of IUP or ectopic on u/s, reviewed by me. Hgb is stable. I discussed that early PUO is a consideration but given that her HCG is downtrending and her bleeding had increased, suspect active miscarriage, possibly complete. I also discussed low but real possibility of ectopic not identified today. Will have her f/u with OB for serial HCG and exams. Pt updated and in agreement. UA negative  for UTI.  Re: SOB - h/o asthma and pt has mild wheezing on exam. Known sick contacts at home. Improved with nebs. Will give steroids, albuterol. No hypoxia. No signs to suggest PE, PNA.  ____________________________________________  FINAL CLINICAL IMPRESSION(S) / ED DIAGNOSES  Final diagnoses:  Vaginal bleeding  Mild intermittent asthma with acute exacerbation  Threatened miscarriage     MEDICATIONS GIVEN DURING THIS VISIT:  Medications  lactated ringers bolus 1,000 mL (0 mLs Intravenous Stopped 04/02/21 1411)  albuterol (PROVENTIL) (2.5 MG/3ML) 0.083% nebulizer solution 5 mg (5 mg Nebulization Given 04/02/21 1212)  methylPREDNISolone sodium succinate (SOLU-MEDROL) 125 mg/2 mL injection 125 mg (125 mg Intravenous Given 04/02/21 1349)  albuterol (PROVENTIL) (2.5 MG/3ML) 0.083% nebulizer solution 2.5 mg (2.5 mg Nebulization Given 04/02/21 1350)     ED Discharge Orders          Ordered    albuterol (VENTOLIN HFA) 108 (90 Base) MCG/ACT inhaler  Every 4 hours PRN        04/02/21 1347    predniSONE (DELTASONE) 20 MG tablet  Daily with breakfast        04/02/21 1347             Note:  This document was prepared using Dragon voice recognition software and may include unintentional dictation errors.   Shaune Pollack, MD 04/02/21 [phone]

## 2021-04-02 NOTE — ED Triage Notes (Signed)
Pt via POV from home. Pt has multiple complaints. Pt c/o SOB, cough and pain when she takes a deep breath in her sternum since Wednesday. Pt has a inhaler that she used with no relief. Pt also c/o abd cramping and vaginal bleeding. Bleeding started Thursday and the pain started today. Pt states that she is approx 5-6 weeks. Pt states blood is bright red and it looks like spotting. LMP 02/22/21.  Pt is A&OX4 and NAD

## 2021-04-04 DIAGNOSIS — O021 Missed abortion: Secondary | ICD-10-CM | POA: Diagnosis not present

## 2021-04-04 DIAGNOSIS — O0281 Inappropriate change in quantitative human chorionic gonadotropin (hCG) in early pregnancy: Secondary | ICD-10-CM | POA: Diagnosis not present

## 2021-04-05 ENCOUNTER — Encounter: Payer: Medicaid Other | Admitting: Physical Therapy

## 2021-04-07 DIAGNOSIS — O0281 Inappropriate change in quantitative human chorionic gonadotropin (hCG) in early pregnancy: Secondary | ICD-10-CM | POA: Diagnosis not present

## 2021-04-07 DIAGNOSIS — O021 Missed abortion: Secondary | ICD-10-CM | POA: Diagnosis not present

## 2021-04-12 DIAGNOSIS — O0281 Inappropriate change in quantitative human chorionic gonadotropin (hCG) in early pregnancy: Secondary | ICD-10-CM | POA: Diagnosis not present

## 2021-04-12 DIAGNOSIS — N898 Other specified noninflammatory disorders of vagina: Secondary | ICD-10-CM | POA: Diagnosis not present

## 2021-04-12 DIAGNOSIS — O021 Missed abortion: Secondary | ICD-10-CM | POA: Diagnosis not present

## 2021-04-13 ENCOUNTER — Inpatient Hospital Stay (HOSPITAL_COMMUNITY)
Admission: AD | Admit: 2021-04-13 | Discharge: 2021-04-13 | Disposition: A | Discharge status: Home / Self Care | Payer: BC Managed Care – PPO | Attending: Obstetrics and Gynecology | Admitting: Obstetrics and Gynecology

## 2021-04-13 ENCOUNTER — Other Ambulatory Visit: Payer: Self-pay

## 2021-04-13 DIAGNOSIS — O009 Unspecified ectopic pregnancy without intrauterine pregnancy: Secondary | ICD-10-CM | POA: Diagnosis not present

## 2021-04-13 DIAGNOSIS — N939 Abnormal uterine and vaginal bleeding, unspecified: Secondary | ICD-10-CM | POA: Diagnosis not present

## 2021-04-13 DIAGNOSIS — O0091 Unspecified ectopic pregnancy with intrauterine pregnancy: Secondary | ICD-10-CM

## 2021-04-13 LAB — CBC WITH DIFFERENTIAL/PLATELET
Abs Immature Granulocytes: 0.02 10*3/uL (ref 0.00–0.07)
Basophils Absolute: 0.1 10*3/uL (ref 0.0–0.1)
Basophils Relative: 1 %
Eosinophils Absolute: 0.6 10*3/uL — ABNORMAL HIGH (ref 0.0–0.5)
Eosinophils Relative: 5 %
HCT: 36.8 % (ref 36.0–46.0)
Hemoglobin: 11.6 g/dL — ABNORMAL LOW (ref 12.0–15.0)
Immature Granulocytes: 0 %
Lymphocytes Relative: 29 %
Lymphs Abs: 3.5 10*3/uL (ref 0.7–4.0)
MCH: 26.6 pg (ref 26.0–34.0)
MCHC: 31.5 g/dL (ref 30.0–36.0)
MCV: 84.4 fL (ref 80.0–100.0)
Monocytes Absolute: 0.5 10*3/uL (ref 0.1–1.0)
Monocytes Relative: 4 %
Neutro Abs: 7.2 10*3/uL (ref 1.7–7.7)
Neutrophils Relative %: 61 %
Platelets: 383 10*3/uL (ref 150–400)
RBC: 4.36 MIL/uL (ref 3.87–5.11)
RDW: 14.1 % (ref 11.5–15.5)
WBC: 11.9 10*3/uL — ABNORMAL HIGH (ref 4.0–10.5)
nRBC: 0 % (ref 0.0–0.2)

## 2021-04-13 LAB — COMPREHENSIVE METABOLIC PANEL
ALT: 14 U/L (ref 0–44)
AST: 14 U/L — ABNORMAL LOW (ref 15–41)
Albumin: 3.9 g/dL (ref 3.5–5.0)
Alkaline Phosphatase: 70 U/L (ref 38–126)
Anion gap: 8 (ref 5–15)
BUN: 8 mg/dL (ref 6–20)
CO2: 25 mmol/L (ref 22–32)
Calcium: 9.5 mg/dL (ref 8.9–10.3)
Chloride: 103 mmol/L (ref 98–111)
Creatinine, Ser: 0.77 mg/dL (ref 0.44–1.00)
GFR, Estimated: >60 mL/min (ref >60)
Glucose, Bld: 109 mg/dL — ABNORMAL HIGH (ref 70–99)
Potassium: 4 mmol/L (ref 3.5–5.1)
Sodium: 136 mmol/L (ref 135–145)
Total Bilirubin: 0.3 mg/dL (ref 0.3–1.2)
Total Protein: 7 g/dL (ref 6.5–8.1)

## 2021-04-13 LAB — HCG, QUANTITATIVE, PREGNANCY: hCG, Beta Chain, Quant, S: 51 m[IU]/mL — ABNORMAL HIGH (ref <5)

## 2021-04-13 MED ORDER — METHOTREXATE FOR ECTOPIC PREGNANCY
50.0000 mg/m2 | Freq: Once | INTRAMUSCULAR | Status: AC
  Administered 2021-04-13: 95 mg via INTRAMUSCULAR
  Filled 2021-04-13: qty 10

## 2021-04-13 NOTE — Progress Notes (Signed)
History    CC: here for labs and MTX for presumed ectopic pregnancy  HPI 31 yo G2P0101 BF presents for labs and MTX for presumed ectopic pregnancy. Pt initially presented to  [facility]  12/25 with vaginal bleeding and had Hquant dropped to 44 from a 101. Sonogram done there Did not reveal an IUP or adnexal mass. Pt did not have and have not had any abdominal pain and has had  Bleeding stopped. F/U hquant has plateau ( 38, 35, 41). Repeat sonogram again was normal OB History     Gravida  2   Para      Term      Preterm   1   AB      Living         SAB      IAB      Ectopic      Multiple      Live Births   1           Past Medical History:  Diagnosis Date   Hypothyroidism     No past surgical history on file.  No family history on file.  Social History   Tobacco Use   Smoking status: Never   Smokeless tobacco: Never  Vaping Use   Vaping Use: Former  Substance Use Topics   Alcohol use: No   Drug use: No    Allergies:  Allergies  Allergen Reactions   Amoxicillin Hives   Penicillins Hives, Itching and Swelling    Medications Prior to Admission  Medication Sig Dispense Refill Last Dose   albuterol (VENTOLIN HFA) 108 (90 Base) MCG/ACT inhaler Inhale 2 puffs into the lungs every 6 (six) hours as needed for wheezing or shortness of breath. 18 g 2    albuterol (VENTOLIN HFA) 108 (90 Base) MCG/ACT inhaler Inhale 2 puffs into the lungs every 4 (four) hours as needed for wheezing or shortness of breath. 8 g 0    metroNIDAZOLE (METROGEL VAGINAL) 0.75 % vaginal gel Place 1 Applicatorful vaginally 2 (two) times a week. 2 x weekly for 6 months 70 g 2      Physical Exam   Blood pressure 119/68, pulse 83, temperature 98 F (36.7 C), resp. rate 18, last menstrual period 02/22/2021.  No exam performed today,  here for outpt lab and treatment . CMP     Component Value Date/Time   NA 136 04/13/2021 1548   K 4.0 04/13/2021 1548   CL 103 04/13/2021  1548   CO2 25 04/13/2021 1548   GLUCOSE 109 (H) 04/13/2021 1548   BUN 8 04/13/2021 1548   CREATININE 0.77 04/13/2021 1548   CALCIUM 9.5 04/13/2021 1548   PROT 7.0 04/13/2021 1548   ALBUMIN 3.9 04/13/2021 1548   AST 14 (L) 04/13/2021 1548   ALT 14 04/13/2021 1548   ALKPHOS 70 04/13/2021 1548   BILITOT 0.3 04/13/2021 1548   GFRNONAA >60 04/13/2021 1548   GFRAA >90 06/02/2011 0925    CBC Latest Ref Rng & Units 04/13/2021 04/02/2021 12/01/2020  WBC 4.0 - 10.5 K/uL 11.9(H) 9.7 12.7(H)  Hemoglobin 12.0 - 15.0 g/dL 11.6(L) 11.6(L) 11.5(L)  Hematocrit 36.0 - 46.0 % 36.8 35.5(L) 35.3(L)  Platelets 150 - 400 K/uL 383 283 181   Hquant 51 ED Course  Presumed ectopic pregnancy unspecified location Reviewed labs Pt agree to proceed with MTX  Stressed need to return 1/8 as well as stop MVI Avoid sun exposures Call for abdominal pain, vaginal bleeding  Abstain from  intercourse F/u D4 lab D/c home MDM   Marvene Staff, MD 6:00 PM 04/13/2021

## 2021-04-13 NOTE — Discharge Instructions (Signed)
Come to MAU on Sunday for repeat LAbs.

## 2021-04-13 NOTE — MAU Note (Signed)
Pt sent from office for ectopic pregnancy.

## 2021-04-16 ENCOUNTER — Inpatient Hospital Stay (HOSPITAL_COMMUNITY)
Admission: AD | Admit: 2021-04-16 | Discharge: 2021-04-16 | Disposition: A | Discharge status: Still In Hospital | Payer: BC Managed Care – PPO | Attending: Obstetrics and Gynecology | Admitting: Obstetrics and Gynecology

## 2021-04-16 ENCOUNTER — Other Ambulatory Visit: Payer: Self-pay

## 2021-04-16 ENCOUNTER — Other Ambulatory Visit (HOSPITAL_COMMUNITY)
Admission: AD | Admit: 2021-04-16 | Discharge: 2021-04-16 | Disposition: A | Discharge status: Home / Self Care | Payer: BC Managed Care – PPO | Source: Home / Self Care | Attending: Obstetrics and Gynecology | Admitting: Obstetrics and Gynecology

## 2021-04-16 ENCOUNTER — Other Ambulatory Visit: Payer: Self-pay | Admitting: Obstetrics and Gynecology

## 2021-04-16 DIAGNOSIS — Z3A00 Weeks of gestation of pregnancy not specified: Secondary | ICD-10-CM | POA: Insufficient documentation

## 2021-04-16 DIAGNOSIS — O009 Unspecified ectopic pregnancy without intrauterine pregnancy: Secondary | ICD-10-CM

## 2021-04-16 LAB — HCG, QUANTITATIVE, PREGNANCY: hCG, Beta Chain, Quant, S: 52 m[IU]/mL — ABNORMAL HIGH (ref <5)

## 2021-04-16 NOTE — MAU Note (Signed)
Spoke with Dr Cherly Hensen- pt to go to outpatient lab for hcg

## 2021-04-18 ENCOUNTER — Other Ambulatory Visit: Payer: Self-pay | Admitting: Obstetrics and Gynecology

## 2021-04-18 DIAGNOSIS — O0091 Unspecified ectopic pregnancy with intrauterine pregnancy: Secondary | ICD-10-CM

## 2021-04-19 ENCOUNTER — Other Ambulatory Visit (HOSPITAL_COMMUNITY)
Admission: RE | Admit: 2021-04-19 | Discharge: 2021-04-19 | Disposition: A | Discharge status: Home / Self Care | Payer: BC Managed Care – PPO | Source: Other Acute Inpatient Hospital | Attending: Obstetrics and Gynecology | Admitting: Obstetrics and Gynecology

## 2021-04-19 DIAGNOSIS — O0091 Unspecified ectopic pregnancy with intrauterine pregnancy: Secondary | ICD-10-CM | POA: Diagnosis not present

## 2021-04-19 LAB — HCG, QUANTITATIVE, PREGNANCY: hCG, Beta Chain, Quant, S: 57 m[IU]/mL — ABNORMAL HIGH (ref <5)

## 2021-04-21 ENCOUNTER — Encounter (HOSPITAL_COMMUNITY): Payer: Self-pay | Admitting: Obstetrics and Gynecology

## 2021-04-21 ENCOUNTER — Other Ambulatory Visit: Payer: Self-pay | Admitting: Obstetrics and Gynecology

## 2021-04-21 ENCOUNTER — Inpatient Hospital Stay (HOSPITAL_COMMUNITY)
Admission: AD | Admit: 2021-04-21 | Discharge: 2021-04-21 | Disposition: A | Discharge status: Home / Self Care | Payer: BC Managed Care – PPO | Attending: Obstetrics and Gynecology | Admitting: Obstetrics and Gynecology

## 2021-04-21 ENCOUNTER — Other Ambulatory Visit: Payer: Self-pay

## 2021-04-21 DIAGNOSIS — R1031 Right lower quadrant pain: Secondary | ICD-10-CM | POA: Diagnosis not present

## 2021-04-21 DIAGNOSIS — O008 Other ectopic pregnancy without intrauterine pregnancy: Secondary | ICD-10-CM | POA: Diagnosis not present

## 2021-04-21 DIAGNOSIS — O009 Unspecified ectopic pregnancy without intrauterine pregnancy: Secondary | ICD-10-CM | POA: Diagnosis present

## 2021-04-21 LAB — COMPREHENSIVE METABOLIC PANEL
ALT: 15 U/L (ref 0–44)
AST: 18 U/L (ref 15–41)
Albumin: 4.3 g/dL (ref 3.5–5.0)
Alkaline Phosphatase: 70 U/L (ref 38–126)
Anion gap: 8 (ref 5–15)
BUN: 9 mg/dL (ref 6–20)
CO2: 26 mmol/L (ref 22–32)
Calcium: 9.4 mg/dL (ref 8.9–10.3)
Chloride: 102 mmol/L (ref 98–111)
Creatinine, Ser: 0.69 mg/dL (ref 0.44–1.00)
GFR, Estimated: >60 mL/min (ref >60)
Glucose, Bld: 94 mg/dL (ref 70–99)
Potassium: 4 mmol/L (ref 3.5–5.1)
Sodium: 136 mmol/L (ref 135–145)
Total Bilirubin: 0.5 mg/dL (ref 0.3–1.2)
Total Protein: 7.7 g/dL (ref 6.5–8.1)

## 2021-04-21 LAB — CBC WITH DIFFERENTIAL/PLATELET
Abs Immature Granulocytes: 0.04 10*3/uL (ref 0.00–0.07)
Basophils Absolute: 0.1 10*3/uL (ref 0.0–0.1)
Basophils Relative: 1 %
Eosinophils Absolute: 0.6 10*3/uL — ABNORMAL HIGH (ref 0.0–0.5)
Eosinophils Relative: 5 %
HCT: 37.8 % (ref 36.0–46.0)
Hemoglobin: 12.1 g/dL (ref 12.0–15.0)
Immature Granulocytes: 0 %
Lymphocytes Relative: 35 %
Lymphs Abs: 4.4 10*3/uL — ABNORMAL HIGH (ref 0.7–4.0)
MCH: 27 pg (ref 26.0–34.0)
MCHC: 32 g/dL (ref 30.0–36.0)
MCV: 84.4 fL (ref 80.0–100.0)
Monocytes Absolute: 0.5 10*3/uL (ref 0.1–1.0)
Monocytes Relative: 4 %
Neutro Abs: 7.1 10*3/uL (ref 1.7–7.7)
Neutrophils Relative %: 55 %
Platelets: 438 10*3/uL — ABNORMAL HIGH (ref 150–400)
RBC: 4.48 MIL/uL (ref 3.87–5.11)
RDW: 14.1 % (ref 11.5–15.5)
WBC: 12.7 10*3/uL — ABNORMAL HIGH (ref 4.0–10.5)
nRBC: 0 % (ref 0.0–0.2)

## 2021-04-21 LAB — HCG, QUANTITATIVE, PREGNANCY: hCG, Beta Chain, Quant, S: 59 m[IU]/mL — ABNORMAL HIGH (ref <5)

## 2021-04-21 MED ORDER — METHOTREXATE FOR ECTOPIC PREGNANCY
50.0000 mg/m2 | Freq: Once | INTRAMUSCULAR | Status: AC
  Administered 2021-04-21: 95 mg via INTRAMUSCULAR
  Filled 2021-04-21: qty 10

## 2021-04-21 NOTE — Progress Notes (Signed)
Sonya Castro, CNM attempted to complete MSE, pt not in lobby or family waiting room.

## 2021-04-21 NOTE — MAU Note (Signed)
Presents for Methotrexate injection.  Sent by Dr. Garwin Brothers.

## 2021-04-21 NOTE — Discharge Instructions (Signed)
Resume prior MTX instructions

## 2021-04-21 NOTE — MAU Provider Note (Signed)
History     Chief Complaint  Patient presents with   Methotrexate Injection   31 yo G2P1001 BF with presumed ectopic pregnancy presents for 2nd MTX injection after  persistent abnl  Hquant. Pt denies vaginal bleeding or abdominal pain  OB History     Gravida  1   Para      Term      Preterm      AB      Living         SAB      IAB      Ectopic      Multiple      Live Births              Past Medical History:  Diagnosis Date   Hypothyroidism     History reviewed. No pertinent surgical history.  History reviewed. No pertinent family history.  Social History   Tobacco Use   Smoking status: Never   Smokeless tobacco: Never  Vaping Use   Vaping Use: Former  Substance Use Topics   Alcohol use: No   Drug use: No    Allergies:  Allergies  Allergen Reactions   Amoxicillin Hives   Penicillins Hives, Itching and Swelling    Medications Prior to Admission  Medication Sig Dispense Refill Last Dose   albuterol (VENTOLIN HFA) 108 (90 Base) MCG/ACT inhaler Inhale 2 puffs into the lungs every 6 (six) hours as needed for wheezing or shortness of breath. 18 g 2    albuterol (VENTOLIN HFA) 108 (90 Base) MCG/ACT inhaler Inhale 2 puffs into the lungs every 4 (four) hours as needed for wheezing or shortness of breath. 8 g 0    metroNIDAZOLE (METROGEL VAGINAL) 0.75 % vaginal gel Place 1 Applicatorful vaginally 2 (two) times a week. 2 x weekly for 6 months 70 g 2      Physical Exam   Blood pressure 113/75, pulse 93, temperature 98.5 F (36.9 C), temperature source Oral, resp. rate 18, height 5' (1.524 m), weight 87.5 kg, last menstrual period 02/22/2021, SpO2 99 %.  No exam performed today,    . CBC Latest Ref Rng & Units 04/21/2021 04/13/2021 04/02/2021  WBC 4.0 - 10.5 K/uL 12.7(H) 11.9(H) 9.7  Hemoglobin 12.0 - 15.0 g/dL 12.1 11.6(L) 11.6(L)  Hematocrit 36.0 - 46.0 % 37.8 36.8 35.5(L)  Platelets 150 - 400 K/uL 438(H) 383 283    CMP Latest Ref Rng & Units  04/21/2021 04/13/2021 04/02/2021  Glucose 70 - 99 mg/dL 94 109(H) 105(H)  BUN 6 - 20 mg/dL 9 8 10   Creatinine 0.44 - 1.00 mg/dL 0.69 0.77 0.62  Sodium 135 - 145 mmol/L 136 136 136  Potassium 3.5 - 5.1 mmol/L 4.0 4.0 3.6  Chloride 98 - 111 mmol/L 102 103 106  CO2 22 - 32 mmol/L 26 25 23   Calcium 8.9 - 10.3 mg/dL 9.4 9.5 8.8(L)  Total Protein 6.5 - 8.1 g/dL 7.7 7.0 7.6  Total Bilirubin 0.3 - 1.2 mg/dL 0.5 0.3 0.6  Alkaline Phos 38 - 126 U/L 70 70 72  AST 15 - 41 U/L 18 14(L) 16  ALT 0 - 44 U/L 15 14 14     BCG 59 IMp: presumed ectopic pregnancy without intrauterine pregnancy Reviewed labs. Confirm remains without sx  P) 2nd MTX. Recheck Hquant 1/16 To continue same instructions as for the 1st injections. Reviewed d/c instructions MDM   Marvene Staff, MD 6:56 PM 04/21/2021

## 2021-04-21 NOTE — Progress Notes (Signed)
Cousins in surgery and unable to enter orders for MTX 2nd injection  Raelyn Mora, CNM

## 2021-04-21 NOTE — MAU Provider Note (Signed)
Event Date/Time   First Provider Initiated Contact with Patient 04/21/21 1737      S Ms. Sonya Castro is a 32 y.o. G1P0 patient who presents to MAU today with complaint of lower RT pelvic pain that radiates down through her leg.   O BP 113/75 (BP Location: Right Arm)    Pulse 93    Temp 98.5 F (36.9 C) (Oral)    Resp 18    Ht 5' (1.524 m)    Wt 87.5 kg    LMP 02/22/2021 (Exact Date)    SpO2 99%    BMI 37.69 kg/m   Physical Exam Constitutional:      Appearance: Normal appearance. She is obese.  Cardiovascular:     Rate and Rhythm: Normal rate.  Genitourinary:    Comments: deferred Musculoskeletal:        General: Normal range of motion.  Neurological:     Mental Status: She is alert and oriented to person, place, and time.  Psychiatric:        Mood and Affect: Mood normal.        Behavior: Behavior normal.        Thought Content: Thought content normal.        Judgment: Judgment normal.    A Medical screening exam complete Ectopic pregnancy without IUP  P Warning signs for worsening condition that would warrant emergency follow-up discussed Patient may return to MAU as needed   Raelyn Mora, [facility] 04/21/2021 5:38 PM

## 2021-04-21 NOTE — MAU Provider Note (Signed)
To lobby to call patient for MSE. Patient not in lobby.  Laury Deep, CNM

## 2021-04-24 ENCOUNTER — Other Ambulatory Visit: Payer: Self-pay | Admitting: Obstetrics and Gynecology

## 2021-04-24 ENCOUNTER — Other Ambulatory Visit (HOSPITAL_COMMUNITY)
Admission: RE | Admit: 2021-04-24 | Discharge: 2021-04-24 | Disposition: A | Discharge status: Home / Self Care | Payer: BC Managed Care – PPO | Source: Other Acute Inpatient Hospital | Attending: Obstetrics and Gynecology | Admitting: Obstetrics and Gynecology

## 2021-04-24 DIAGNOSIS — O0091 Unspecified ectopic pregnancy with intrauterine pregnancy: Secondary | ICD-10-CM | POA: Diagnosis not present

## 2021-04-24 LAB — HCG, QUANTITATIVE, PREGNANCY: hCG, Beta Chain, Quant, S: 37 m[IU]/mL — ABNORMAL HIGH (ref <5)

## 2021-04-27 DIAGNOSIS — O021 Missed abortion: Secondary | ICD-10-CM | POA: Diagnosis not present

## 2021-04-27 DIAGNOSIS — O0281 Inappropriate change in quantitative human chorionic gonadotropin (hCG) in early pregnancy: Secondary | ICD-10-CM | POA: Diagnosis not present

## 2021-05-04 DIAGNOSIS — O021 Missed abortion: Secondary | ICD-10-CM | POA: Diagnosis not present

## 2021-05-04 DIAGNOSIS — O0281 Inappropriate change in quantitative human chorionic gonadotropin (hCG) in early pregnancy: Secondary | ICD-10-CM | POA: Diagnosis not present

## 2021-05-16 ENCOUNTER — Other Ambulatory Visit: Payer: Self-pay

## 2021-05-16 ENCOUNTER — Encounter: Payer: Self-pay | Admitting: Emergency Medicine

## 2021-05-16 ENCOUNTER — Ambulatory Visit: Payer: Self-pay | Admitting: *Deleted

## 2021-05-16 ENCOUNTER — Ambulatory Visit
Admission: EM | Admit: 2021-05-16 | Discharge: 2021-05-16 | Disposition: A | Discharge status: Home / Self Care | Payer: BC Managed Care – PPO | Attending: Emergency Medicine | Admitting: Emergency Medicine

## 2021-05-16 DIAGNOSIS — R509 Fever, unspecified: Secondary | ICD-10-CM

## 2021-05-16 DIAGNOSIS — J4521 Mild intermittent asthma with (acute) exacerbation: Secondary | ICD-10-CM | POA: Diagnosis not present

## 2021-05-16 DIAGNOSIS — R051 Acute cough: Secondary | ICD-10-CM

## 2021-05-16 DIAGNOSIS — R0602 Shortness of breath: Secondary | ICD-10-CM

## 2021-05-16 MED ORDER — METHYLPREDNISOLONE SODIUM SUCC 125 MG IJ SOLR
125.0000 mg | Freq: Once | INTRAMUSCULAR | Status: AC
  Administered 2021-05-16: 125 mg via INTRAMUSCULAR

## 2021-05-16 MED ORDER — PREDNISONE 10 MG (21) PO TBPK: ORAL_TABLET | Freq: Every day | ORAL | 0 refills | Status: DC

## 2021-05-16 MED ORDER — ALBUTEROL SULFATE HFA 108 (90 BASE) MCG/ACT IN AERS
2.0000 | INHALATION_SPRAY | Freq: Once | RESPIRATORY_TRACT | Status: AC
  Administered 2021-05-16: 2 via RESPIRATORY_TRACT

## 2021-05-16 MED ORDER — AEROCHAMBER PLUS FLO-VU MEDIUM MISC
1.0000 | Freq: Once | Status: AC
  Administered 2021-05-16: 1

## 2021-05-16 MED ORDER — ALBUTEROL SULFATE (2.5 MG/3ML) 0.083% IN NEBU
2.5000 mg | INHALATION_SOLUTION | Freq: Once | RESPIRATORY_TRACT | Status: AC
  Administered 2021-05-16: 2.5 mg via RESPIRATORY_TRACT

## 2021-05-16 NOTE — Telephone Encounter (Signed)
°  Chief Complaint: chest pain from coughing  Symptoms: chest pain breathing in, cough noted and audible wheezing , talking few words at a time between coughing Frequency: na Pertinent Negatives: Patient denies na Disposition: [x] ED /[] Urgent Care (no appt availability in office) / [] Appointment(In office/virtual)/ []  [facility]/ [] Home Care/ [] Refused Recommended Disposition /[]  Mobile Bus/ []  Follow-up with PCP Additional Notes:  Instructed patient to seek help now UC/ED due to wheezing     Reason for Disposition  Taking a deep breath makes pain worse  Answer Assessment - Initial Assessment Questions 1. LOCATION: "Where does it hurt?"       In chest  2. RADIATION: "Does the pain go anywhere else?" (e.g., into neck, jaw, arms, back)     na 3. ONSET: "When did the chest pain begin?" (Minutes, hours or days)      na 4. PATTERN "Does the pain come and go, or has it been constant since it started?"  "Does it get worse with exertion?"      Chest pain breathing in 5. DURATION: "How long does it last" (e.g., seconds, minutes, hours)     na 6. SEVERITY: "How bad is the pain?"  (e.g., Scale 1-10; mild, moderate, or severe)    - MILD (1-3): doesn't interfere with normal activities     - MODERATE (4-7): interferes with normal activities or awakens from sleep    - SEVERE (8-10): excruciating pain, unable to do any normal activities       na 7. CARDIAC RISK FACTORS: "Do you have any history of heart problems or risk factors for heart disease?" (e.g., angina, prior heart attack; diabetes, high blood pressure, high cholesterol, smoker, or strong family history of heart disease)     na 8. PULMONARY RISK FACTORS: "Do you have any history of lung disease?"  (e.g., blood clots in lung, asthma, emphysema, birth control pills)     na 9. CAUSE: "What do you think is causing the chest pain?"     Cough , sounds like wheezing  10. OTHER SYMPTOMS: "Do you have any other symptoms?"  (e.g., dizziness, nausea, vomiting, sweating, fever, difficulty breathing, cough)       Chest pain breathing in , wheezing, cough  11. PREGNANCY: "Is there any chance you are pregnant?" "When was your last menstrual period?"       na  Protocols used: Chest Pain-A-AH

## 2021-05-16 NOTE — ED Triage Notes (Addendum)
Pt here with asthma exacerbation with wheezing and SOB x 2 days. Does not have any asthma tx at home as she is newly dx.

## 2021-05-16 NOTE — ED Provider Notes (Addendum)
Renaldo Fiddler    CSN: [id number] Arrival date & time: 05/16/21  1105      History   Chief Complaint Chief Complaint  Patient presents with   Asthma    HPI Sonya Castro is a 31 y.o. female.  Patient presents with wheezing, cough, and shortness of breath since yesterday.  She is out of her albuterol inhaler.  She does not have a nebulizer machine.  She reports low grade fever, body aches, fatigue, and diarrhea since yesterday.  Tmax 100.  She denies chest pain, vomiting, or other symptoms.  Patient had an asthma exacerbation in December; she was seen at [facility] ED on 04/02/2021; discharged on prednisone and albuterol inhaler.  LMP: 04/24/2021.   The history is provided by the patient and medical records.   Past Medical History:  Diagnosis Date   Hypothyroidism     Patient Active Problem List   Diagnosis Date Noted   Ectopic pregnancy without intrauterine pregnancy 04/21/2021   Anxiety 01/13/2021   Hypothyroidism 01/13/2021   Anemia 07/30/2019    History reviewed. No pertinent surgical history.  OB History     Gravida  1   Para      Term      Preterm      AB      Living         SAB      IAB      Ectopic      Multiple      Live Births               Home Medications    Prior to Admission medications   Medication Sig Start Date End Date Taking? Authorizing Provider  predniSONE (STERAPRED UNI-PAK 21 TAB) 10 MG (21) TBPK tablet Take by mouth daily. As directed 05/17/21  Yes Mickie Bail, NP  albuterol (VENTOLIN HFA) 108 (90 Base) MCG/ACT inhaler Inhale 2 puffs into the lungs every 6 (six) hours as needed for wheezing or shortness of breath. 03/15/21   Larae Grooms, NP  albuterol (VENTOLIN HFA) 108 (90 Base) MCG/ACT inhaler Inhale 2 puffs into the lungs every 4 (four) hours as needed for wheezing or shortness of breath. 04/02/21   Shaune Pollack, MD    Family History Family History  Family history unknown: Yes    Social  History Social History   Tobacco Use   Smoking status: Never   Smokeless tobacco: Never  Vaping Use   Vaping Use: Former  Substance Use Topics   Alcohol use: No   Drug use: No     Allergies   Amoxicillin and Penicillins   Review of Systems Review of Systems  Constitutional:  Positive for fatigue and fever. Negative for chills.  HENT:  Negative for ear pain and sore throat.   Respiratory:  Positive for cough, shortness of breath and wheezing.   Cardiovascular:  Negative for chest pain and palpitations.  Gastrointestinal:  Positive for diarrhea. Negative for abdominal pain and vomiting.  Skin:  Negative for color change and rash.  All other systems reviewed and are negative.   Physical Exam Triage Vital Signs ED Triage Vitals  Enc Vitals Group     BP      Pulse      Resp      Temp      Temp src      SpO2      Weight      Height      Head Circumference  Peak Flow      Pain Score      Pain Loc      Pain Edu?      Excl. in [place]?    No data found.  Updated Vital Signs BP 101/71    Pulse (!) 118    Temp 98.9 F (37.2 C) (Oral)    Resp 18    LMP 02/22/2021 (Exact Date)    SpO2 98%    Breastfeeding Unknown   Visual Acuity Right Eye Distance:   Left Eye Distance:   Bilateral Distance:    Right Eye Near:   Left Eye Near:    Bilateral Near:     Physical Exam Vitals and nursing note reviewed.  Constitutional:      General: She is not in acute distress.    Appearance: She is well-developed.  HENT:     Right Ear: Tympanic membrane normal.     Left Ear: Tympanic membrane normal.     Nose: Nose normal.     Mouth/Throat:     Mouth: Mucous membranes are moist.     Pharynx: Oropharynx is clear.  Cardiovascular:     Rate and Rhythm: Normal rate and regular rhythm.     Heart sounds: Normal heart sounds.  Pulmonary:     Effort: Tachypnea present.     Breath sounds: Decreased air movement present. Wheezing present.     Comments: Expiratory wheezes  throughout. Musculoskeletal:     Cervical back: Neck supple.  Skin:    General: Skin is warm and dry.  Neurological:     General: No focal deficit present.     Mental Status: She is alert and oriented to person, place, and time.     Gait: Gait normal.  Psychiatric:        Mood and Affect: Mood normal.        Behavior: Behavior normal.     UC Treatments / Results  Labs (all labs ordered are listed, but only abnormal results are displayed) Labs Reviewed  COVID-19, FLU A+B AND RSV    EKG   Radiology No results found.  Procedures Procedures (including critical care time)  Medications Ordered in UC Medications  albuterol (PROVENTIL) (2.5 MG/3ML) 0.083% nebulizer solution 2.5 mg (2.5 mg Nebulization Given 05/16/21 1132)  albuterol (VENTOLIN HFA) 108 (90 Base) MCG/ACT inhaler 2 puff (2 puffs Inhalation Given 05/16/21 1132)  AeroChamber Plus Flo-Vu Medium MISC 1 each (1 each Other Given 05/16/21 1132)  methylPREDNISolone sodium succinate (SOLU-MEDROL) 125 mg/2 mL injection 125 mg (125 mg Intramuscular Given 05/16/21 1132)    Initial Impression / Assessment and Plan / UC Course  I have reviewed the triage vital signs and the nursing notes.  Pertinent labs & imaging results that were available during my care of the patient were reviewed by me and considered in my medical decision making (see chart for details).    Asthma exacerbation, shortness of breath, cough, fever.  Albuterol nebulizer treatment given here and significant improvement noted.  She still has scattered expiratory wheezes but has good air movement.  O2 sat 98% on room air.  Solu-medrol given here; starting prednisone taper tomorrow.  Albuterol inhaler with spacer given here.  Strict 911 and ED precautions discussed. Instructed patient to follow up with her PCP tomorrow.  She agrees to plan of care.    Final Clinical Impressions(s) / UC Diagnoses   Final diagnoses:  Mild intermittent asthma with acute exacerbation   Shortness of breath  Fever, unspecified  Acute cough     Discharge Instructions      You were given an injection of a steroid called Solu-Medrol.  Start the prednisone taper tomorrow as directed.    Use the albuterol inhaler as directed.    Call 911 and go to the emergency department if you have difficulty breathing or other concerning symptoms.    See your primary care provider tomorrow to discuss your asthma and arrange for a nebulizer machine at home.             ED Prescriptions     Medication Sig Dispense Auth. Provider   predniSONE (STERAPRED UNI-PAK 21 TAB) 10 MG (21) TBPK tablet Take by mouth daily. As directed 21 tablet Sharion Balloon, NP      PDMP not reviewed this encounter.   Sharion Balloon, NP 05/16/21 1138    Sharion Balloon, NP 05/16/21 1139

## 2021-05-16 NOTE — Discharge Instructions (Addendum)
You were given an injection of a steroid called Solu-Medrol.  Start the prednisone taper tomorrow as directed.    Use the albuterol inhaler as directed.    Call 911 and go to the emergency department if you have difficulty breathing or other concerning symptoms.    See your primary care provider tomorrow to discuss your asthma and arrange for a nebulizer machine at home.

## 2021-05-18 LAB — COVID-19, FLU A+B AND RSV
Influenza A, NAA: NOT DETECTED
Influenza B, NAA: NOT DETECTED
RSV, NAA: NOT DETECTED
SARS-CoV-2, NAA: NOT DETECTED

## 2021-05-30 ENCOUNTER — Ambulatory Visit (INDEPENDENT_AMBULATORY_CARE_PROVIDER_SITE_OTHER): Payer: Medicaid Other | Admitting: Plastic Surgery

## 2021-05-30 DIAGNOSIS — F419 Anxiety disorder, unspecified: Secondary | ICD-10-CM

## 2021-05-30 NOTE — Progress Notes (Signed)
No show

## 2021-06-05 NOTE — Progress Notes (Signed)
No show

## 2021-06-12 ENCOUNTER — Ambulatory Visit (INDEPENDENT_AMBULATORY_CARE_PROVIDER_SITE_OTHER): Payer: BC Managed Care – PPO | Admitting: Internal Medicine

## 2021-06-12 ENCOUNTER — Other Ambulatory Visit: Payer: Self-pay

## 2021-06-12 ENCOUNTER — Encounter: Payer: Self-pay | Admitting: Internal Medicine

## 2021-06-12 VITALS — BP 106/74 | HR 103 | Temp 99.8°F | Ht 60.0 in | Wt 188.7 lb

## 2021-06-12 DIAGNOSIS — R062 Wheezing: Secondary | ICD-10-CM | POA: Insufficient documentation

## 2021-06-12 MED ORDER — METHYLPREDNISOLONE SODIUM SUCC 40 MG IJ SOLR: 40.0000 mg | Freq: Once | INTRAMUSCULAR | Status: DC

## 2021-06-12 MED ORDER — ALBUTEROL SULFATE (2.5 MG/3ML) 0.083% IN NEBU
2.5000 mg | INHALATION_SOLUTION | Freq: Once | RESPIRATORY_TRACT | Status: AC
  Administered 2021-06-12: 2.5 mg via RESPIRATORY_TRACT

## 2021-06-12 MED ORDER — METHYLPREDNISOLONE SODIUM SUCC 40 MG IJ SOLR
120.0000 mg | Freq: Once | INTRAMUSCULAR | Status: AC
  Administered 2021-06-12: 120 mg via INTRAMUSCULAR

## 2021-06-12 MED ORDER — ALBUTEROL SULFATE HFA 108 (90 BASE) MCG/ACT IN AERS: 2.0000 | INHALATION_SPRAY | Freq: Four times a day (QID) | RESPIRATORY_TRACT | 0 refills | Status: DC | PRN

## 2021-06-12 MED ORDER — METHYLPREDNISOLONE 4 MG PO TBPK: ORAL_TABLET | ORAL | 0 refills | Status: DC

## 2021-06-12 MED ORDER — FLUTICASONE FUROATE-VILANTEROL 100-25 MCG/ACT IN AEPB: 1.0000 | INHALATION_SPRAY | Freq: Every day | RESPIRATORY_TRACT | 1 refills | Status: DC

## 2021-06-12 NOTE — Addendum Note (Signed)
Addended by: Leward Quan A on: 06/12/2021 04:20 PM ? ? Modules accepted: Orders ? ?

## 2021-06-12 NOTE — Progress Notes (Signed)
? ?BP 106/74   Pulse (!) 103   Temp 99.8 ?F (37.7 ?C) (Oral)   Ht 5' (1.524 m)   Wt 188 lb 11.2 oz (85.6 kg)   SpO2 98%   BMI 36.85 kg/m?   ? ?Subjective:  ? ? Patient ID: Sonya Castro, female    DOB: August 03, 1990, 31 y.o.   MRN: [id number] ? ?Chief Complaint  ?Patient presents with  ?? Shortness of Breath  ?  Has been having a hard time breathing, has also been using friends nebulizer with no help  ? ? ?HPI: ?Sonya Castro is a 31 y.o. female ? ?Shortness of Breath ?This is a new (dec 2022 was diagnosed with asthma.) problem. Associated symptoms include sputum production and wheezing. Pertinent negatives include no abdominal pain, chest pain, claudication, coryza, ear pain, fever, hemoptysis, leg pain, leg swelling, neck pain, orthopnea, PND, rash, rhinorrhea, sore throat, swollen glands or vomiting.  ? ?Chief Complaint  ?Patient presents with  ?? Shortness of Breath  ?  Has been having a hard time breathing, has also been using friends nebulizer with no help  ? ? ?Relevant past medical, surgical, family and social history reviewed and updated as indicated. Interim medical history since our last visit reviewed. ?Allergies and medications reviewed and updated. ? ?Review of Systems  ?Constitutional:  Negative for fever.  ?HENT:  Negative for ear pain, rhinorrhea and sore throat.   ?Respiratory:  Positive for sputum production, shortness of breath and wheezing. Negative for hemoptysis.   ?Cardiovascular:  Negative for chest pain, orthopnea, claudication, leg swelling and PND.  ?Gastrointestinal:  Negative for abdominal pain and vomiting.  ?Musculoskeletal:  Negative for neck pain.  ?Skin:  Negative for rash.  ? ?Per HPI unless specifically indicated above ? ?   ?Objective:  ?  ?BP 106/74   Pulse (!) 103   Temp 99.8 ?F (37.7 ?C) (Oral)   Ht 5' (1.524 m)   Wt 188 lb 11.2 oz (85.6 kg)   SpO2 98%   BMI 36.85 kg/m?   ?Wt Readings from Last 3 Encounters:  ?06/12/21 188 lb 11.2 oz (85.6 kg)  ?04/21/21 193 lb (87.5 kg)   ?04/02/21 191 lb (86.6 kg)  ?  ?Physical Exam ?Vitals and nursing note reviewed.  ?Constitutional:   ?   General: She is not in acute distress. ?   Appearance: Normal appearance. She is not ill-appearing or diaphoretic.  ?Eyes:  ?   Conjunctiva/sclera: Conjunctivae normal.  ?Pulmonary:  ?   Breath sounds: Examination of the right-upper field reveals decreased breath sounds. Examination of the left-upper field reveals decreased breath sounds and wheezing. Examination of the right-middle field reveals decreased breath sounds and wheezing. Examination of the left-middle field reveals decreased breath sounds and wheezing. Examination of the right-lower field reveals decreased breath sounds. Examination of the left-lower field reveals decreased breath sounds. Decreased breath sounds and wheezing present. No rhonchi.  ?Abdominal:  ?   General: Abdomen is flat. There is no distension.  ?Skin: ?   General: Skin is warm and dry.  ?   Coloration: Skin is not jaundiced.  ?   Findings: No erythema.  ?Neurological:  ?   Mental Status: She is alert.  ? ? ?Results for orders placed or performed during the hospital encounter of 05/16/21  ?COVID-19, Flu A+B and RSV ([facility])  ?Result Value Ref Range  ? SARS-CoV-2, NAA Not Detected Not Detected  ? Influenza A, NAA Not Detected Not Detected  ? Influenza B, NAA Not Detected  Not Detected  ? RSV, NAA Not Detected Not Detected  ? Test Information: Comment   ? ?   ? ? ?Current Outpatient Medications:  ??  albuterol (VENTOLIN HFA) 108 (90 Base) MCG/ACT inhaler, Inhale 2 puffs into the lungs every 4 (four) hours as needed for wheezing or shortness of breath., Disp: 8 g, Rfl: 0 ??  albuterol (VENTOLIN HFA) 108 (90 Base) MCG/ACT inhaler, Inhale 2 puffs into the lungs every 6 (six) hours as needed for wheezing or shortness of breath., Disp: 8 g, Rfl: 0 ??  fluticasone furoate-vilanterol (BREO ELLIPTA) 100-25 MCG/ACT AEPB, Inhale 1 puff into the lungs daily., Disp: 1 each, Rfl: 1 ??   methylPREDNISolone (MEDROL DOSEPAK) 4 MG TBPK tablet, Use as directed, Disp: 1 each, Rfl: 0  ? ? ?Assessment & Plan:  ?Wheezing / ? Asthma ?Will need wu with PFTs ?Will start pt on breo , will send off medrolo dose pak and albuterol for ac attacks ?120 mg solumedrol administered today. ? ? ?Problem List Items Addressed This Visit   ? ?  ? Other  ? Wheezing - Primary  ?  ? ?No orders of the defined types were placed in this encounter. ?  ? ?Meds ordered this encounter  ?Medications  ?? methylPREDNISolone (MEDROL DOSEPAK) 4 MG TBPK tablet  ?  Sig: Use as directed  ?  Dispense:  1 each  ?  Refill:  0  ?? albuterol (VENTOLIN HFA) 108 (90 Base) MCG/ACT inhaler  ?  Sig: Inhale 2 puffs into the lungs every 6 (six) hours as needed for wheezing or shortness of breath.  ?  Dispense:  8 g  ?  Refill:  0  ?? fluticasone furoate-vilanterol (BREO ELLIPTA) 100-25 MCG/ACT AEPB  ?  Sig: Inhale 1 puff into the lungs daily.  ?  Dispense:  1 each  ?  Refill:  1  ?  ? ?Follow up plan: ?Return in about 2 weeks (around 06/26/2021). ? ?

## 2021-06-28 ENCOUNTER — Ambulatory Visit: Payer: BC Managed Care – PPO | Admitting: Dietician

## 2021-06-28 ENCOUNTER — Other Ambulatory Visit: Payer: Self-pay

## 2021-06-28 ENCOUNTER — Ambulatory Visit (INDEPENDENT_AMBULATORY_CARE_PROVIDER_SITE_OTHER): Payer: BC Managed Care – PPO | Admitting: Nurse Practitioner

## 2021-06-28 ENCOUNTER — Encounter: Payer: Self-pay | Admitting: Nurse Practitioner

## 2021-06-28 VITALS — BP 99/61 | HR 83 | Temp 98.3°F | Wt 192.8 lb

## 2021-06-28 DIAGNOSIS — R062 Wheezing: Secondary | ICD-10-CM | POA: Diagnosis not present

## 2021-06-28 MED ORDER — MONTELUKAST SODIUM 10 MG PO TABS: 10.0000 mg | ORAL_TABLET | Freq: Every day | ORAL | 1 refills | Status: DC

## 2021-06-28 MED ORDER — ALBUTEROL SULFATE (2.5 MG/3ML) 0.083% IN NEBU: 2.5000 mg | INHALATION_SOLUTION | Freq: Four times a day (QID) | RESPIRATORY_TRACT | 1 refills | Status: DC | PRN

## 2021-06-28 MED ORDER — FLUTICASONE PROPIONATE 50 MCG/ACT NA SUSP: 2.0000 | Freq: Every day | NASAL | 6 refills | Status: DC

## 2021-06-28 MED ORDER — FLUTICASONE FUROATE-VILANTEROL 100-25 MCG/ACT IN AEPB: 1.0000 | INHALATION_SPRAY | Freq: Every day | RESPIRATORY_TRACT | 1 refills | Status: DC

## 2021-06-28 NOTE — Assessment & Plan Note (Signed)
Chronic. Has improved some with using Breo.  Will refill for patient at visit today.  She is usually it daily as prescribed.  Continue with PRN use of Albuterol.  Will also send nebulizer since she has more relief from that at times.  Will start Singular daily as well as Flonase to help with congestion and allergy symptoms.  Follow up in 1 month for reevaluation. ?

## 2021-06-28 NOTE — Progress Notes (Signed)
? ?BP 99/61   Pulse 83   Temp 98.3 ?F (36.8 ?C) (Oral)   Wt 192 lb 12.8 oz (87.5 kg)   SpO2 99%   BMI 37.65 kg/m?   ? ?Subjective:  ? ? Patient ID: Sonya Castro, female    DOB: [birth year], 31 y.o.   MRN: [id number] ? ?HPI: ?Sonya Castro is a 31 y.o. female ? ?Chief Complaint  ?Patient presents with  ? Asthma  ? Allergic Rhinitis   ?  Pt states she has tried Zyrtec D and Claritin in the past with no relief   ? ? ?WHEEZING ?Duration: Months  ?Onset: gradual ?Description of breathing discomfort: when she goes home her symptoms are worse ?Severity:  improved some from last visit after starting the Breo ?Episode duration: every evening ?Frequency: daily ?Related to exertion: no ?Cough: yes non-productive ?Chest tightness: no ?Wheezing: yes ?Fevers: no ?Chest pain: no ?Palpitations: yes when she uses the inhaler ?Nausea: no ?Diaphoresis: no ?Deconditioning: no ?Status: better ? ? ?Relevant past medical, surgical, family and social history reviewed and updated as indicated. Interim medical history since our last visit reviewed. ?Allergies and medications reviewed and updated. ? ?Review of Systems  ?HENT:  Positive for congestion.   ?Respiratory:  Positive for cough, shortness of breath and wheezing. Negative for chest tightness.   ?Cardiovascular:  Positive for palpitations. Negative for chest pain.  ? ?Per HPI unless specifically indicated above ? ?   ?Objective:  ?  ?BP 99/61   Pulse 83   Temp 98.3 ?F (36.8 ?C) (Oral)   Wt 192 lb 12.8 oz (87.5 kg)   SpO2 99%   BMI 37.65 kg/m?   ?Wt Readings from Last 3 Encounters:  ?06/28/21 192 lb 12.8 oz (87.5 kg)  ?06/12/21 188 lb 11.2 oz (85.6 kg)  ?04/21/21 193 lb (87.5 kg)  ?  ?Physical Exam ?Vitals and nursing note reviewed.  ?Constitutional:   ?   General: She is not in acute distress. ?   Appearance: Normal appearance. She is normal weight. She is not ill-appearing, toxic-appearing or diaphoretic.  ?HENT:  ?   Head: Normocephalic.  ?   Right Ear: External ear normal.  ?    Left Ear: External ear normal.  ?   Nose: Nose normal.  ?   Mouth/Throat:  ?   Mouth: Mucous membranes are moist.  ?   Pharynx: Oropharynx is clear.  ?Eyes:  ?   General:     ?   Right eye: No discharge.     ?   Left eye: No discharge.  ?   Extraocular Movements: Extraocular movements intact.  ?   Conjunctiva/sclera: Conjunctivae normal.  ?   Pupils: Pupils are equal, round, and reactive to light.  ?Cardiovascular:  ?   Rate and Rhythm: Normal rate and regular rhythm.  ?   Heart sounds: No murmur heard. ?Pulmonary:  ?   Effort: Pulmonary effort is normal. No respiratory distress.  ?   Breath sounds: Normal breath sounds. No wheezing or rales.  ?Musculoskeletal:  ?   Cervical back: Normal range of motion and neck supple.  ?Skin: ?   General: Skin is warm and dry.  ?   Capillary Refill: Capillary refill takes less than 2 seconds.  ?Neurological:  ?   General: No focal deficit present.  ?   Mental Status: She is alert and oriented to person, place, and time. Mental status is at baseline.  ?Psychiatric:     ?   Mood and Affect:  Mood normal.     ?   Behavior: Behavior normal.     ?   Thought Content: Thought content normal.     ?   Judgment: Judgment normal.  ? ? ?Results for orders placed or performed during the hospital encounter of 05/16/21  ?COVID-19, Flu A+B and RSV ([facility])  ?Result Value Ref Range  ? SARS-CoV-2, NAA Not Detected Not Detected  ? Influenza A, NAA Not Detected Not Detected  ? Influenza B, NAA Not Detected Not Detected  ? RSV, NAA Not Detected Not Detected  ? Test Information: Comment   ? ?   ?Assessment & Plan:  ? ?Problem List Items Addressed This Visit   ? ?  ? Other  ? Wheezing - Primary  ?  Chronic. Has improved some with using Breo.  Will refill for patient at visit today.  She is usually it daily as prescribed.  Continue with PRN use of Albuterol.  Will also send nebulizer since she has more relief from that at times.  Will start Singular daily as well as Flonase to help with congestion and  allergy symptoms.  Follow up in 1 month for reevaluation. ?  ?  ?  ? ?Follow up plan: ?Return in about 1 month (around 07/29/2021) for Breathing check. ? ? ? ? ? ?

## 2021-07-10 ENCOUNTER — Other Ambulatory Visit: Payer: Self-pay | Admitting: Internal Medicine

## 2021-07-11 NOTE — Telephone Encounter (Signed)
Requested Prescriptions  ?Pending Prescriptions Disp Refills  ?? albuterol (VENTOLIN HFA) 108 (90 Base) MCG/ACT inhaler [Pharmacy Med Name: ALBUTEROL HFA (PROAIR) INHALER] 8.5 each 0  ?  Sig: TAKE 2 PUFFS BY MOUTH EVERY 6 HOURS AS NEEDED FOR WHEEZE OR SHORTNESS OF BREATH  ?  ? Pulmonology:  [facility] 2 Passed - 07/11/2021  1:09 PM  ?  ?  Passed - Last BP in normal range  ?  BP Readings from Last 1 Encounters:  ?06/28/21 99/61  ?   ?  ?  Passed - Last Heart Rate in normal range  ?  Pulse Readings from Last 1 Encounters:  ?06/28/21 83  ?   ?  ?  Passed - Valid encounter within last 12 months  ?  Recent Outpatient Visits   ?      ? 1 week ago Wheezing  ? [facility] Larae Grooms, NP  ? 4 weeks ago Wheezing  ? [facility] Vigg, Avanti, MD  ? 3 months ago Positive pregnancy test  ? [facility] Larae Grooms, NP  ? 3 months ago Morbid obesity (HCC)  ? [facility] Larae Grooms, NP  ? 4 months ago Upper back pain  ? [facility] Larae Grooms, NP  ?  ?  ?Future Appointments   ?        ? In 2 weeks Larae Grooms, NP [facility]  ? In 2 months Larae Grooms, NP [facility]  ?  ? ?  ?  ?  ? ? ?

## 2021-07-24 DIAGNOSIS — Z20822 Contact with and (suspected) exposure to covid-19: Secondary | ICD-10-CM | POA: Diagnosis not present

## 2021-07-24 DIAGNOSIS — W1830XA Fall on same level, unspecified, initial encounter: Secondary | ICD-10-CM | POA: Diagnosis not present

## 2021-07-24 DIAGNOSIS — H65111 Acute and subacute allergic otitis media (mucoid) (sanguinous) (serous), right ear: Secondary | ICD-10-CM | POA: Diagnosis not present

## 2021-07-24 DIAGNOSIS — H9201 Otalgia, right ear: Secondary | ICD-10-CM | POA: Diagnosis not present

## 2021-07-24 DIAGNOSIS — Y9389 Activity, other specified: Secondary | ICD-10-CM | POA: Diagnosis not present

## 2021-07-31 ENCOUNTER — Ambulatory Visit: Payer: BC Managed Care – PPO | Admitting: Nurse Practitioner

## 2021-07-31 NOTE — Progress Notes (Deleted)
There were no vitals taken for this visit.   Subjective:    Patient ID: Sonya Castro, female    DOB: 1990/06/01, 31 y.o.   MRN: [id number]  HPI: Sonya Castro is a 31 y.o. female  No chief complaint on file.   WHEEZING Duration: Months  Onset: gradual Description of breathing discomfort: when she goes home her symptoms are worse Severity:  improved some from last visit after starting the Breo Episode duration: every evening Frequency: daily Related to exertion: no Cough: yes non-productive Chest tightness: no Wheezing: yes Fevers: no Chest pain: no Palpitations: yes when she uses the inhaler Nausea: no Diaphoresis: no Deconditioning: no Status: better   Relevant past medical, surgical, family and social history reviewed and updated as indicated. Interim medical history since our last visit reviewed. Allergies and medications reviewed and updated.  Review of Systems  HENT:  Positive for congestion.   Respiratory:  Positive for cough, shortness of breath and wheezing. Negative for chest tightness.   Cardiovascular:  Positive for palpitations. Negative for chest pain.   Per HPI unless specifically indicated above     Objective:    There were no vitals taken for this visit.  Wt Readings from Last 3 Encounters:  06/28/21 192 lb 12.8 oz (87.5 kg)  06/12/21 188 lb 11.2 oz (85.6 kg)  04/21/21 193 lb (87.5 kg)    Physical Exam Vitals and nursing note reviewed.  Constitutional:      General: She is not in acute distress.    Appearance: Normal appearance. She is normal weight. She is not ill-appearing, toxic-appearing or diaphoretic.  HENT:     Head: Normocephalic.     Right Ear: External ear normal.     Left Ear: External ear normal.     Nose: Nose normal.     Mouth/Throat:     Mouth: Mucous membranes are moist.     Pharynx: Oropharynx is clear.  Eyes:     General:        Right eye: No discharge.        Left eye: No discharge.     Extraocular Movements:  Extraocular movements intact.     Conjunctiva/sclera: Conjunctivae normal.     Pupils: Pupils are equal, round, and reactive to light.  Cardiovascular:     Rate and Rhythm: Normal rate and regular rhythm.     Heart sounds: No murmur heard. Pulmonary:     Effort: Pulmonary effort is normal. No respiratory distress.     Breath sounds: Normal breath sounds. No wheezing or rales.  Musculoskeletal:     Cervical back: Normal range of motion and neck supple.  Skin:    General: Skin is warm and dry.     Capillary Refill: Capillary refill takes less than 2 seconds.  Neurological:     General: No focal deficit present.     Mental Status: She is alert and oriented to person, place, and time. Mental status is at baseline.  Psychiatric:        Mood and Affect: Mood normal.        Behavior: Behavior normal.        Thought Content: Thought content normal.        Judgment: Judgment normal.    Results for orders placed or performed during the hospital encounter of 05/16/21  COVID-19, Flu A+B and RSV ([facility])  Result Value Ref Range   SARS-CoV-2, NAA Not Detected Not Detected   Influenza A, NAA Not Detected Not Detected  Influenza B, NAA Not Detected Not Detected   RSV, NAA Not Detected Not Detected   Test Information: Comment       Assessment & Plan:   Problem List Items Addressed This Visit      Other   Wheezing - Primary     Follow up plan: No follow-ups on file.

## 2021-08-11 ENCOUNTER — Ambulatory Visit: Payer: BC Managed Care – PPO | Admitting: Dietician

## 2021-09-03 ENCOUNTER — Encounter: Payer: Self-pay | Admitting: Intensive Care

## 2021-09-03 ENCOUNTER — Other Ambulatory Visit: Payer: Self-pay

## 2021-09-03 ENCOUNTER — Emergency Department
Admission: EM | Admit: 2021-09-03 | Discharge: 2021-09-03 | Disposition: A | Discharge status: Home / Self Care | Payer: BC Managed Care – PPO | Attending: Emergency Medicine | Admitting: Emergency Medicine

## 2021-09-03 ENCOUNTER — Emergency Department: Payer: BC Managed Care – PPO

## 2021-09-03 DIAGNOSIS — R059 Cough, unspecified: Secondary | ICD-10-CM | POA: Diagnosis not present

## 2021-09-03 DIAGNOSIS — J45901 Unspecified asthma with (acute) exacerbation: Secondary | ICD-10-CM | POA: Diagnosis not present

## 2021-09-03 DIAGNOSIS — J45909 Unspecified asthma, uncomplicated: Secondary | ICD-10-CM | POA: Diagnosis not present

## 2021-09-03 DIAGNOSIS — R0602 Shortness of breath: Secondary | ICD-10-CM | POA: Diagnosis not present

## 2021-09-03 DIAGNOSIS — R062 Wheezing: Secondary | ICD-10-CM | POA: Diagnosis not present

## 2021-09-03 DIAGNOSIS — Z7951 Long term (current) use of inhaled steroids: Secondary | ICD-10-CM | POA: Insufficient documentation

## 2021-09-03 MED ORDER — PREDNISONE 20 MG PO TABS: 40.0000 mg | ORAL_TABLET | Freq: Every day | ORAL | 0 refills | Status: AC

## 2021-09-03 MED ORDER — IPRATROPIUM-ALBUTEROL 0.5-2.5 (3) MG/3ML IN SOLN
3.0000 mL | Freq: Once | RESPIRATORY_TRACT | Status: AC
  Administered 2021-09-03: 3 mL via RESPIRATORY_TRACT
  Filled 2021-09-03: qty 3

## 2021-09-03 MED ORDER — METHYLPREDNISOLONE SODIUM SUCC 125 MG IJ SOLR
125.0000 mg | Freq: Once | INTRAMUSCULAR | Status: AC
  Administered 2021-09-03: 125 mg via INTRAMUSCULAR
  Filled 2021-09-03: qty 2

## 2021-09-03 NOTE — Discharge Instructions (Signed)
Please continue with albuterol inhaler/nebulizer as needed.  Take prednisone as prescribed and return to the ER for any shortness of breath, chest tightness, chest pain, difficulty breathing or any urgent changes in your health.

## 2021-09-03 NOTE — ED Triage Notes (Signed)
Patient reports flare up of asthma that started last night. Little relief with inhaler and treatments at home. Discomfort with breath

## 2021-09-03 NOTE — ED Provider Notes (Signed)
[place] EMERGENCY DEPARTMENT Provider Note   CSN: [id number] Arrival date & time: 09/03/21  [id number]     History  Chief Complaint  Patient presents with   Wheezing    Sonya Castro is a 31 y.o. female.  With past medical history of asthma presents to the emergency department for evaluation of cough, wheezing, shortness of breath.  Patient states she has been sick for several days with productive cough, congestion.  She denies any fevers.  Last night she developed wheezing and chest tightness, she tried using 2 albuterol nebulizers with very little relief.  She denies any hemoptysis, history of PEs/DVTs.  She does not smoke and is not on birth control pills.  No recent surgeries.  HPI     Home Medications Prior to Admission medications   Medication Sig Start Date End Date Taking? Authorizing Provider  predniSONE (DELTASONE) 20 MG tablet Take 2 tablets (40 mg total) by mouth daily for 5 days. 09/03/21 09/08/21 Yes Duanne Guess, PA-C  albuterol (PROVENTIL) (2.5 MG/3ML) 0.083% nebulizer solution Take 3 mLs (2.5 mg total) by nebulization every 6 (six) hours as needed for wheezing or shortness of breath. 06/28/21   Jon Billings, NP  albuterol (VENTOLIN HFA) 108 (90 Base) MCG/ACT inhaler TAKE 2 PUFFS BY MOUTH EVERY 6 HOURS AS NEEDED FOR WHEEZE OR SHORTNESS OF BREATH 07/11/21   Jon Billings, NP  fluticasone (FLONASE) 50 MCG/ACT nasal spray Place 2 sprays into both nostrils daily. 06/28/21   Jon Billings, NP  fluticasone furoate-vilanterol (BREO ELLIPTA) 100-25 MCG/ACT AEPB Inhale 1 puff into the lungs daily. 06/28/21   Jon Billings, NP  montelukast (SINGULAIR) 10 MG tablet Take 1 tablet (10 mg total) by mouth at bedtime. 06/28/21   Jon Billings, NP      Allergies    Amoxicillin and Penicillins    Review of Systems   Review of Systems  Physical Exam Updated Vital Signs BP 119/79 (BP Location: Left Arm)   Pulse 100   Temp 97.7 F (36.5 C)  (Oral)   Resp (!) 22   Ht 5\' 5"  (1.651 m)   Wt 77.1 kg   SpO2 98%   BMI 28.29 kg/m  Physical Exam Constitutional:      Appearance: She is well-developed.  HENT:     Head: Normocephalic and atraumatic.  Eyes:     Conjunctiva/sclera: Conjunctivae normal.  Cardiovascular:     Rate and Rhythm: Normal rate.  Pulmonary:     Effort: Pulmonary effort is normal. No respiratory distress.     Breath sounds: Wheezing present.  Musculoskeletal:        General: Normal range of motion.     Cervical back: Normal range of motion.  Skin:    General: Skin is warm.     Findings: No rash.  Neurological:     Mental Status: She is alert and oriented to person, place, and time.  Psychiatric:        Behavior: Behavior normal.        Thought Content: Thought content normal.    ED Results / Procedures / Treatments   Labs (all labs ordered are listed, but only abnormal results are displayed) Labs Reviewed - No data to display  EKG None  Radiology DG Chest 2 View  Result Date: 09/03/2021 CLINICAL DATA:  Cough and wheezing.  Asthma exacerbation. EXAM: CHEST - 2 VIEW COMPARISON:  None Available. FINDINGS: The heart size and mediastinal contours are within normal limits. Both lungs are clear. The  visualized skeletal structures are unremarkable. IMPRESSION: No active cardiopulmonary disease. Electronically Signed   By: San Morelle M.D.   On: 09/03/2021 07:52    Procedures Procedures    Medications Ordered in ED Medications  ipratropium-albuterol (DUONEB) 0.5-2.5 (3) MG/3ML nebulizer solution 3 mL (3 mLs Nebulization Given 09/03/21 0731)  methylPREDNISolone sodium succinate (SOLU-MEDROL) 125 mg/2 mL injection 125 mg (125 mg Intramuscular Given 09/03/21 0731)  ipratropium-albuterol (DUONEB) 0.5-2.5 (3) MG/3ML nebulizer solution 3 mL (3 mLs Nebulization Given 09/03/21 0754)    ED Course/ Medical Decision Making/ A&P                           Medical Decision Making Amount and/or  Complexity of Data Reviewed Radiology: ordered.  Risk Prescription drug management.   Differential diagnosis consist of pneumothorax, pneumonia, pulmonary embolism, asthma exacerbation.    31 year old female with history of asthma presents with wheezing, chest tightness.  Patient also with several days of mucus, congestion with slightly productive cough.  Chest x-ray ordered and reviewed by me today shows no acute cardiopulmonary process.  No signs of pneumonia or pneumothorax.  Patient's vital signs are stable with normal O2 sats.  Patient's symptoms completely resolved with methylprednisolone IM along with DuoNeb treatment x2.  Patient placed on 5 days of steroid 40 mg daily, will continue with albuterol inhaler/nebs at home.  Patient understands signs symptoms return to the ER for.  Final Clinical Impression(s) / ED Diagnoses Final diagnoses:  Mild asthma with exacerbation, unspecified whether persistent  Wheezing    Rx / DC Orders ED Discharge Orders          Ordered    predniSONE (DELTASONE) 20 MG tablet  Daily        09/03/21 0826              Duanne Guess, PA-C 09/03/21 0830    Naaman Plummer, MD 09/03/21 1544

## 2021-09-05 ENCOUNTER — Encounter (HOSPITAL_BASED_OUTPATIENT_CLINIC_OR_DEPARTMENT_OTHER): Payer: Self-pay | Admitting: Emergency Medicine

## 2021-09-05 ENCOUNTER — Other Ambulatory Visit: Payer: Self-pay

## 2021-09-05 ENCOUNTER — Emergency Department (HOSPITAL_BASED_OUTPATIENT_CLINIC_OR_DEPARTMENT_OTHER)
Admission: EM | Admit: 2021-09-05 | Discharge: 2021-09-06 | Disposition: A | Discharge status: Home / Self Care | Payer: BC Managed Care – PPO | Attending: Emergency Medicine | Admitting: Emergency Medicine

## 2021-09-05 DIAGNOSIS — B349 Viral infection, unspecified: Secondary | ICD-10-CM | POA: Diagnosis not present

## 2021-09-05 DIAGNOSIS — R0602 Shortness of breath: Secondary | ICD-10-CM | POA: Insufficient documentation

## 2021-09-05 DIAGNOSIS — R0981 Nasal congestion: Secondary | ICD-10-CM | POA: Insufficient documentation

## 2021-09-05 DIAGNOSIS — Z20822 Contact with and (suspected) exposure to covid-19: Secondary | ICD-10-CM | POA: Insufficient documentation

## 2021-09-05 MED ORDER — ALBUTEROL SULFATE (2.5 MG/3ML) 0.083% IN NEBU
2.5000 mg | INHALATION_SOLUTION | Freq: Once | RESPIRATORY_TRACT | Status: AC
  Administered 2021-09-05: 2.5 mg via RESPIRATORY_TRACT
  Filled 2021-09-05: qty 3

## 2021-09-05 MED ORDER — IPRATROPIUM-ALBUTEROL 0.5-2.5 (3) MG/3ML IN SOLN
3.0000 mL | Freq: Once | RESPIRATORY_TRACT | Status: AC
  Administered 2021-09-05: 3 mL via RESPIRATORY_TRACT
  Filled 2021-09-05: qty 3

## 2021-09-05 NOTE — ED Triage Notes (Signed)
Pt is c/o shortness of breath and chest tightness  Pt was seen at [facility] on Sunday morning   Pt was put on prednisone but states she does not think it is helping

## 2021-09-06 ENCOUNTER — Telehealth: Payer: Self-pay | Admitting: *Deleted

## 2021-09-06 DIAGNOSIS — R0602 Shortness of breath: Secondary | ICD-10-CM | POA: Diagnosis not present

## 2021-09-06 LAB — RESP PANEL BY RT-PCR (FLU A&B, COVID) ARPGX2
Influenza A by PCR: NEGATIVE
Influenza B by PCR: NEGATIVE
SARS Coronavirus 2 by RT PCR: NEGATIVE

## 2021-09-06 MED ORDER — GUAIFENESIN 100 MG/5ML PO LIQD
5.0000 mL | Freq: Once | ORAL | Status: AC
  Administered 2021-09-06: 5 mL via ORAL
  Filled 2021-09-06: qty 10

## 2021-09-06 MED ORDER — DM-GUAIFENESIN ER 30-600 MG PO TB12: 1.0000 | ORAL_TABLET | Freq: Two times a day (BID) | ORAL | 0 refills | Status: DC

## 2021-09-06 MED ORDER — LORATADINE 10 MG PO TABS
10.0000 mg | ORAL_TABLET | Freq: Once | ORAL | Status: AC
  Administered 2021-09-06: 10 mg via ORAL
  Filled 2021-09-06: qty 1

## 2021-09-06 NOTE — Discharge Instructions (Signed)
Start flonase daily and claritin every morning.  Follow up with your doctor for recheck

## 2021-09-06 NOTE — Telephone Encounter (Signed)
Transition Care Management Unsuccessful Follow-up Telephone Call  Date of discharge and from where:  [facility] 09-03-2021  Attempts:  1st Attempt  Reason for unsuccessful TCM follow-up call:  Left voice message

## 2021-09-06 NOTE — ED Provider Notes (Signed)
[facility] EMERGENCY DEPARTMENT Provider Note   CSN: [id number] Arrival date & time: 09/05/21  2237     History  Chief Complaint  Patient presents with   Shortness of Breath    Sonya Castro is a 31 y.o. female.  The history is provided by the patient.  Shortness of Breath Severity:  Moderate Onset quality:  Gradual Duration:  10 days Timing:  Constant Progression:  Waxing and waning Chronicity:  New Context: URI   Relieved by:  Nothing Worsened by:  Nothing Ineffective treatments:  Inhaler Associated symptoms: cough, sputum production and wheezing   Associated symptoms: no hemoptysis, no neck pain, no PND, no rash and no swollen glands   Associated symptoms comment:  Nasal congestion  Risk factors: no family hx of DVT, no hx of cancer, no hx of PE/DVT, no oral contraceptive use, no prolonged immobilization, no recent surgery and no tobacco use   Patient [facility] for congestion, cough [facility] and started on steroids presents with ongoing symptoms that have not clea    Home Medications Prior to Admission medications   Medication Sig Start Date End Date Taking? Authorizing Provider  albuterol (PROVENTIL) (2.5 MG/3ML) 0.083% nebulizer solution Take 3 mLs (2.5 mg total) by nebulization every 6 (six) hours as needed for wheezing or shortness of breath. 06/28/21   Larae GroomsHoldsworth, Karen, NP  albuterol (VENTOLIN HFA) 108 (90 Base) MCG/ACT inhaler TAKE 2 PUFFS BY MOUTH EVERY 6 HOURS AS NEEDED FOR WHEEZE OR SHORTNESS OF BREATH 07/11/21   Larae GroomsHoldsworth, Karen, NP  fluticasone (FLONASE) 50 MCG/ACT nasal spray Place 2 sprays into both nostrils daily. 06/28/21   Larae GroomsHoldsworth, Karen, NP  fluticasone furoate-vilanterol (BREO ELLIPTA) 100-25 MCG/ACT AEPB Inhale 1 puff into the lungs daily. 06/28/21   Larae GroomsHoldsworth, Karen, NP  montelukast (SINGULAIR) 10 MG tablet Take 1 tablet (10 mg total) by mouth at bedtime. 06/28/21   Larae GroomsHoldsworth, Karen, NP  predniSONE (DELTASONE) 20 MG tablet Take 2  tablets (40 mg total) by mouth daily for 5 days. 09/03/21 09/08/21  Evon SlackGaines, Thomas C, PA-C      Allergies    Amoxicillin and Penicillins    Review of Systems   Review of Systems  Respiratory:  Positive for cough, sputum production, shortness of breath and wheezing. Negative for hemoptysis.   Cardiovascular:  Negative for PND.  Musculoskeletal:  Negative for neck pain.  Skin:  Negative for rash.   Physical Exam Updated Vital Signs BP 114/81 (BP Location: Left Arm)   Pulse 92   Temp 98.3 F (36.8 C) (Oral)   Resp 20   Ht 5\' 5"  (1.651 m)   Wt 78 kg   LMP 09/03/2021 (Exact Date)   SpO2 98%   BMI 28.62 kg/m  Physical Exam Vitals and nursing note reviewed.  Constitutional:      General: She is not in acute distress.    Appearance: She is well-developed. She is not ill-appearing or diaphoretic.  HENT:     Head: Normocephalic and atraumatic.     Nose: Congestion present. No rhinorrhea.     Mouth/Throat:     Mouth: Mucous membranes are moist.  Eyes:     Conjunctiva/sclera: Conjunctivae normal.     Pupils: Pupils are equal, round, and reactive to light.     Comments: Normal appearance  Cardiovascular:     Rate and Rhythm: Normal rate and regular rhythm.     Pulses: Normal pulses.     Heart sounds: Normal heart sounds.  Pulmonary:  Effort: Pulmonary effort is normal. No respiratory distress.     Breath sounds: Normal breath sounds. No wheezing.  Abdominal:     General: Bowel sounds are normal. There is no distension.     Palpations: Abdomen is soft. There is no mass.     Tenderness: There is no abdominal tenderness. There is no guarding or rebound.  Genitourinary:    Comments: No CVA tenderness Musculoskeletal:        General: No tenderness. Normal range of motion.     Cervical back: Normal range of motion.     Right lower leg: No edema.     Left lower leg: No edema.     Comments: Negative Homan's sign of BLE  Skin:    General: Skin is warm and dry.     Findings: No  rash.  Neurological:     General: No focal deficit present.     Mental Status: She is alert and oriented to person, place, and time.     Deep Tendon Reflexes: Reflexes normal.  Psychiatric:        Mood and Affect: Mood normal.        Behavior: Behavior normal.    ED Results / Procedures / Treatments   Labs (all labs ordered are listed, but only abnormal results are displayed) Labs Reviewed  RESP PANEL BY RT-PCR (FLU A&B, COVID) [id number]    EKG None  Radiology No results found.  Procedures Procedures    Medications Ordered in ED Medications  ipratropium-albuterol (DUONEB) 0.5-2.5 (3) MG/3ML nebulizer solution 3 mL (3 mLs Nebulization Given 09/05/21 2252)  albuterol (PROVENTIL) (2.5 MG/3ML) 0.083% nebulizer solution 2.5 mg (2.5 mg Nebulization Given 09/05/21 2252)  guaiFENesin (ROBITUSSIN) 100 MG/5ML liquid 5 mL (5 mLs Oral Given 09/06/21 0105)  loratadine (CLARITIN) tablet 10 mg (10 mg Oral Given 09/06/21 0104)    ED Course/ Medical Decision Making/ A&P                           Medical Decision Making URI symptoms and ongoing feeling SOB with wheezing. Does not feel steroids have helped wheezing or cough.  For 2 weeks   Amount and/or Complexity of Data Reviewed External Data Reviewed: radiology and notes.    Details: previous ED notes reivewed.  [facility] XR negative Labs: ordered.    Details: covid and flu are negative  Risk OTC drugs. Risk Details: PERC negative wells 0, highly doubt PE in this low risk patient.  No surgery, no travel, no OCP.  No leg pain.  Has not been bed bound.  I do not believe CTA is indicated at this time. Symptoms are consistent with viral URI.  Likely ongoing bronchospasm but patient is not wheezing at this time.  I have advised claritin and flonase for nasal congestion that is contributing to the feeling as well as mucinex to allow the patient to cough up more.  Patient is very well appearing and stable for discharge.  Strict return precautions  given.     Final Clinical Impression(s) / ED Diagnoses Final diagnoses:  None    Return for intractable cough, coughing up blood, fevers > 100.4 unrelieved by medication, shortness of breath, intractable vomiting, chest pain, shortness of breath, weakness, numbness, changes in speech, facial asymmetry, abdominal pain, passing out, Inability to tolerate liquids or food, cough, altered mental status or any concerns. No signs of systemic illness or infection. The patient is nontoxic-appearing on exam and vital signs  are within normal limits.  I have reviewed the triage vital signs and the nursing notes. Pertinent labs & imaging results that were available during my care of the patient were reviewed by me and considered in my medical decision making (see chart for details). After history, exam, and medical workup I feel the patient has been appropriately medically screened and is safe for discharge home. Pertinent diagnoses were discussed with the patient. Patient was given return precautions.  Rx / DC Orders ED Discharge Orders     None         Wende Longstreth, MD 09/06/21 4403

## 2021-09-07 NOTE — Telephone Encounter (Signed)
Transition Care Management Unsuccessful Follow-up Telephone Call  Date of discharge and from where:  [facility] 09-03-2021  Attempts:  2nd Attempt  Reason for unsuccessful TCM follow-up call:  Left voice message

## 2021-09-11 NOTE — Telephone Encounter (Signed)
Transition Care Management Unsuccessful Follow-up Telephone Call  Date of discharge and from where:  [facility] 09-03-2021  Attempts:  3rd Attempt  Reason for unsuccessful TCM follow-up call:  Left voice message

## 2021-09-12 NOTE — Progress Notes (Deleted)
   LMP 09/03/2021 (Exact Date)    Subjective:    Patient ID: Sonya Castro, female    DOB: 1990-07-26, 31 y.o.   MRN: [id number]  HPI: Sonya Castro is a 31 y.o. female  No chief complaint on file.  VAGINAL DISCHARGE Duration: {Blank single:19197::"days","weeks","months"} Discharge description: {Blank single:19197::"white","cottage cheese","clear","yellow","mucous"}  Pruritus: {Blank single:19197::"yes","no"} Dysuria: {Blank single:19197::"yes","no"} Malodorous: {Blank single:19197::"yes","no","worse after sex"} Urinary frequency: {Blank single:19197::"yes","no"} Fevers: {Blank single:19197::"yes","no"} Abdominal pain: {Blank single:19197::"yes","no"}  Sexual activity: {Blank single:19197::"not sexually active","monogamous","practicing safe sex","concerned about STIs"} History of sexually transmitted diseases: {Blank single:19197::"yes","no"} Recent antibiotic use: {Blank single:19197::"yes","no"} Context: {Blank multiple:19196::"better","worse","previous yeast infections","recurrent yeast infections","recurrent BV"}  Treatments attempted: {Blank multiple:19196::"none","antifungal","vagisil"}  Relevant past medical, surgical, family and social history reviewed and updated as indicated. Interim medical history since our last visit reviewed. Allergies and medications reviewed and updated.  Review of Systems  Per HPI unless specifically indicated above     Objective:    LMP 09/03/2021 (Exact Date)   Wt Readings from Last 3 Encounters:  09/05/21 172 lb (78 kg)  09/03/21 170 lb (77.1 kg)  06/28/21 192 lb 12.8 oz (87.5 kg)    Physical Exam  Results for orders placed or performed during the hospital encounter of 09/05/21  Resp Panel by RT-PCR (Flu A&B, Covid) Anterior Nasal Swab   Specimen: Anterior Nasal Swab  Result Value Ref Range   SARS Coronavirus 2 by RT PCR NEGATIVE NEGATIVE   Influenza A by PCR NEGATIVE NEGATIVE   Influenza B by PCR NEGATIVE NEGATIVE      Assessment &  Plan:   Problem List Items Addressed This Visit   None Visit Diagnoses     Vaginal discharge    -  Primary        Follow up plan: No follow-ups on file.

## 2021-09-13 ENCOUNTER — Ambulatory Visit: Payer: BC Managed Care – PPO | Admitting: Nurse Practitioner

## 2021-09-13 DIAGNOSIS — N898 Other specified noninflammatory disorders of vagina: Secondary | ICD-10-CM

## 2021-09-25 ENCOUNTER — Other Ambulatory Visit: Payer: Self-pay | Admitting: Nurse Practitioner

## 2021-09-25 NOTE — Telephone Encounter (Signed)
Requested Prescriptions  Pending Prescriptions Disp Refills  . VENTOLIN HFA 108 (90 Base) MCG/ACT inhaler [Pharmacy Med Name: VENTOLIN HFA 90 MCG INHALER] 18 each 0    Sig: TAKE 2 PUFFS BY MOUTH EVERY 6 HOURS AS NEEDED FOR WHEEZE OR SHORTNESS OF BREATH     Pulmonology:  [facility] 2 Passed - 09/25/2021  1:59 AM      Passed - Last BP in normal range    BP Readings from Last 1 Encounters:  09/06/21 114/81         Passed - Last Heart Rate in normal range    Pulse Readings from Last 1 Encounters:  09/06/21 92         Passed - Valid encounter within last 12 months    Recent Outpatient Visits          2 months ago Wheezing   [facility] Larae Grooms, NP   3 months ago Wheezing   [facility] Vigg, Avanti, MD   5 months ago Positive pregnancy test   [facility] Larae Grooms, NP   6 months ago Morbid obesity [facility])   [facility] Larae Grooms, NP   7 months ago Upper back pain   [facility] Larae Grooms, NP

## 2021-10-18 ENCOUNTER — Other Ambulatory Visit: Payer: Self-pay | Admitting: Nurse Practitioner

## 2021-10-18 NOTE — Telephone Encounter (Signed)
Requested Prescriptions  Pending Prescriptions Disp Refills  . VENTOLIN HFA 108 (90 Base) MCG/ACT inhaler [Pharmacy Med Name: VENTOLIN HFA 90 MCG INHALER] 18 each 0    Sig: TAKE 2 PUFFS BY MOUTH EVERY 6 HOURS AS NEEDED FOR WHEEZE OR SHORTNESS OF BREATH     Pulmonology:  [facility] 2 Passed - 10/18/2021  2:48 AM      Passed - Last BP in normal range    BP Readings from Last 1 Encounters:  09/06/21 114/81         Passed - Last Heart Rate in normal range    Pulse Readings from Last 1 Encounters:  09/06/21 92         Passed - Valid encounter within last 12 months    Recent Outpatient Visits          3 months ago Wheezing   [facility] Larae Grooms, NP   4 months ago Wheezing   [facility] Vigg, Avanti, MD   6 months ago Positive pregnancy test   [facility] Larae Grooms, NP   7 months ago Morbid obesity [facility])   [facility] Larae Grooms, NP   7 months ago Upper back pain   [facility] Larae Grooms, NP

## 2021-11-27 DIAGNOSIS — Z3200 Encounter for pregnancy test, result unknown: Secondary | ICD-10-CM | POA: Diagnosis not present

## 2021-11-27 DIAGNOSIS — Z3141 Encounter for fertility testing: Secondary | ICD-10-CM | POA: Diagnosis not present

## 2021-12-28 DIAGNOSIS — R062 Wheezing: Secondary | ICD-10-CM | POA: Diagnosis not present

## 2021-12-28 DIAGNOSIS — R059 Cough, unspecified: Secondary | ICD-10-CM | POA: Diagnosis not present

## 2021-12-28 DIAGNOSIS — R0981 Nasal congestion: Secondary | ICD-10-CM | POA: Diagnosis not present

## 2021-12-28 DIAGNOSIS — Z1152 Encounter for screening for COVID-19: Secondary | ICD-10-CM | POA: Diagnosis not present

## 2021-12-31 ENCOUNTER — Emergency Department: Payer: BC Managed Care – PPO

## 2021-12-31 ENCOUNTER — Other Ambulatory Visit: Payer: Self-pay

## 2021-12-31 ENCOUNTER — Emergency Department
Admission: EM | Admit: 2021-12-31 | Discharge: 2021-12-31 | Disposition: A | Discharge status: Home / Self Care | Payer: BC Managed Care – PPO | Attending: Emergency Medicine | Admitting: Emergency Medicine

## 2021-12-31 DIAGNOSIS — J209 Acute bronchitis, unspecified: Secondary | ICD-10-CM | POA: Insufficient documentation

## 2021-12-31 DIAGNOSIS — J4521 Mild intermittent asthma with (acute) exacerbation: Secondary | ICD-10-CM

## 2021-12-31 DIAGNOSIS — J45909 Unspecified asthma, uncomplicated: Secondary | ICD-10-CM | POA: Insufficient documentation

## 2021-12-31 DIAGNOSIS — Z20822 Contact with and (suspected) exposure to covid-19: Secondary | ICD-10-CM | POA: Diagnosis not present

## 2021-12-31 DIAGNOSIS — J40 Bronchitis, not specified as acute or chronic: Secondary | ICD-10-CM

## 2021-12-31 DIAGNOSIS — R079 Chest pain, unspecified: Secondary | ICD-10-CM | POA: Diagnosis not present

## 2021-12-31 DIAGNOSIS — R0602 Shortness of breath: Secondary | ICD-10-CM | POA: Diagnosis not present

## 2021-12-31 LAB — CBC
HCT: 36 % (ref 36.0–46.0)
Hemoglobin: 11.5 g/dL — ABNORMAL LOW (ref 12.0–15.0)
MCH: 26.4 pg (ref 26.0–34.0)
MCHC: 31.9 g/dL (ref 30.0–36.0)
MCV: 82.8 fL (ref 80.0–100.0)
Platelets: 320 10*3/uL (ref 150–400)
RBC: 4.35 MIL/uL (ref 3.87–5.11)
RDW: 14.4 % (ref 11.5–15.5)
WBC: 8.8 10*3/uL (ref 4.0–10.5)
nRBC: 0 % (ref 0.0–0.2)

## 2021-12-31 LAB — BASIC METABOLIC PANEL
Anion gap: 8 (ref 5–15)
BUN: 7 mg/dL (ref 6–20)
CO2: 22 mmol/L (ref 22–32)
Calcium: 9.4 mg/dL (ref 8.9–10.3)
Chloride: 112 mmol/L — ABNORMAL HIGH (ref 98–111)
Creatinine, Ser: 0.55 mg/dL (ref 0.44–1.00)
GFR, Estimated: >60 mL/min (ref >60)
Glucose, Bld: 110 mg/dL — ABNORMAL HIGH (ref 70–99)
Potassium: 2.9 mmol/L — ABNORMAL LOW (ref 3.5–5.1)
Sodium: 142 mmol/L (ref 135–145)

## 2021-12-31 LAB — RESP PANEL BY RT-PCR (FLU A&B, COVID) ARPGX2
Influenza A by PCR: NEGATIVE
Influenza B by PCR: NEGATIVE
SARS Coronavirus 2 by RT PCR: NEGATIVE

## 2021-12-31 LAB — TROPONIN I (HIGH SENSITIVITY): Troponin I (High Sensitivity): <2 ng/L (ref <18)

## 2021-12-31 MED ORDER — SODIUM CHLORIDE 0.9 % IV BOLUS
1000.0000 mL | Freq: Once | INTRAVENOUS | Status: AC
  Administered 2021-12-31: 1000 mL via INTRAVENOUS

## 2021-12-31 MED ORDER — ALBUTEROL SULFATE (2.5 MG/3ML) 0.083% IN NEBU
2.5000 mg | INHALATION_SOLUTION | Freq: Once | RESPIRATORY_TRACT | Status: AC
  Administered 2021-12-31: 2.5 mg via RESPIRATORY_TRACT
  Filled 2021-12-31: qty 3

## 2021-12-31 MED ORDER — IPRATROPIUM-ALBUTEROL 0.5-2.5 (3) MG/3ML IN SOLN
3.0000 mL | Freq: Once | RESPIRATORY_TRACT | Status: AC
  Administered 2021-12-31: 3 mL via RESPIRATORY_TRACT

## 2021-12-31 MED ORDER — KETOROLAC TROMETHAMINE 30 MG/ML IJ SOLN
15.0000 mg | Freq: Once | INTRAMUSCULAR | Status: AC
  Administered 2021-12-31: 15 mg via INTRAVENOUS
  Filled 2021-12-31: qty 1

## 2021-12-31 MED ORDER — ALBUTEROL SULFATE (2.5 MG/3ML) 0.083% IN NEBU: 2.5000 mg | INHALATION_SOLUTION | RESPIRATORY_TRACT | 0 refills | Status: DC | PRN

## 2021-12-31 MED ORDER — METHYLPREDNISOLONE SODIUM SUCC 125 MG IJ SOLR
80.0000 mg | Freq: Once | INTRAMUSCULAR | Status: AC
  Administered 2021-12-31: 80 mg via INTRAVENOUS
  Filled 2021-12-31: qty 2

## 2021-12-31 MED ORDER — POTASSIUM CHLORIDE 20 MEQ PO PACK
40.0000 meq | PACK | Freq: Once | ORAL | Status: AC
  Administered 2021-12-31: 40 meq via ORAL
  Filled 2021-12-31: qty 2

## 2021-12-31 MED ORDER — ACETAMINOPHEN 500 MG PO TABS
1000.0000 mg | ORAL_TABLET | Freq: Once | ORAL | Status: AC
  Administered 2021-12-31: 1000 mg via ORAL
  Filled 2021-12-31: qty 2

## 2021-12-31 MED ORDER — AZITHROMYCIN 250 MG PO TABS: ORAL_TABLET | ORAL | 0 refills | Status: AC

## 2021-12-31 MED ORDER — IPRATROPIUM-ALBUTEROL 0.5-2.5 (3) MG/3ML IN SOLN
3.0000 mL | Freq: Once | RESPIRATORY_TRACT | Status: AC
  Administered 2021-12-31: 3 mL via RESPIRATORY_TRACT
  Filled 2021-12-31: qty 6

## 2021-12-31 MED ORDER — PREDNISONE 10 MG PO TABS: ORAL_TABLET | ORAL | 0 refills | Status: AC

## 2021-12-31 NOTE — ED Notes (Signed)
Pt returned from Xray at this time  

## 2021-12-31 NOTE — ED Notes (Signed)
Dr. Ellender Hose EDP at bedside for reevaluation.

## 2021-12-31 NOTE — ED Notes (Signed)
Report received from Megan, RN

## 2021-12-31 NOTE — ED Provider Notes (Signed)
Patient care assumed from Dr. Kerman Passey.  Plan to discharge after breathing treatments.  On my assessment, the patient states she feels much better.  She does have some ongoing wheezing.  She was given 40 mg of prednisone.  We will plan to taper this for a longer course as an outpatient given the extent of her wheezing, as well as have her schedule her breathing treatments at home.  Otherwise, no apparent infectious process.  She is currently on doxycycline but states this has been making her nauseous so will give azithromycin instead as she was initially treated for possible pneumonia.  No evidence of sepsis.  Potassium is been repleted.  Discharged home.   Duffy Bruce, MD 12/31/21 [phone]

## 2021-12-31 NOTE — ED Notes (Signed)
Pt requesting medication for a headache, states she has had a headache for approx a week. Messaged Dr. Ellender Hose at this time.

## 2021-12-31 NOTE — Discharge Instructions (Addendum)
Continue the current 5 day course of prednisone, then start the taper I prescribed today.  Stop the doxycyline if it's causing stomach upset, and switch to the Azithromycin prescribed today  Drink plenty of fluids  FOR YOUR BREATHING TREATMENTS: - I would recommend mixing 2 vials of albuterol (total of 5 mg) and using this AT LEAST once every 4 hours around-the-clock for the next 24 hours, then as needed. You can use this every 2 hours if needed for several doses. - You can decrease the

## 2021-12-31 NOTE — ED Triage Notes (Signed)
Pt presents to ED via POV with c/o SOB, difficulty breathing. Pt states SOB started  3 days ago. Pt says she would used inhaler and neb treatments but would not help.Pt has also had a cough for about a week, went to doctor and was prescribed medication but says cough hasnt gotten better. Pt also having chest pain

## 2021-12-31 NOTE — ED Notes (Signed)
Pt c/o cough and congestion x 1 week. Pt states was seen at PCP and was dx with asthma, did a breathing tx at approx 0430 this AM with out relief. Pt also c/o cough and HA. 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 Pt able to speak in complete sentences, some dyspnea noted when speaking in complete sentences. Pt A&O x4.

## 2021-12-31 NOTE — ED Provider Notes (Signed)
[facility] Provider Note    Event Date/Time   First MD Initiated Contact with Patient 12/31/21 [phone]     (approximate)  History   Chief Complaint: Shortness of Breath  HPI  Sonya Castro is a 31 y.o. female with a past medical history of reactive airway disease presents to the emergency department for shortness of breath and cough.  According to the patient for the past week or so she has had a frequent cough as well as shortness of breath and wheeze.  She has been using her home nebulizer treatments without much relief so the patient came to the emergency department today for evaluation.  Patient was prescribed prednisone by her doctor for likely bronchitis.  Patient states she has been on these medications for for 5 days and continues to have cough.  Patient denies any fever but does state body aches and headache at times.   Physical Exam   Triage Vital Signs: ED Triage Vitals [12/31/21 0610]  Enc Vitals Group     BP (!) 136/104     Pulse Rate (!) 113     Resp 20     Temp 98.1 F (36.7 C)     Temp Source Oral     SpO2 98 %     Weight 170 lb (77.1 kg)     Height 5' (1.524 m)     Head Circumference      Peak Flow      Pain Score 10     Pain Loc      Pain Edu?      Excl. in GC?     Most recent vital signs: Vitals:   12/31/21 0610  BP: (!) 136/104  Pulse: (!) 113  Resp: 20  Temp: 98.1 F (36.7 C)  SpO2: 98%    General: Awake, no distress.  CV:  Good peripheral perfusion.  Regular rate and rhythm  Resp:  Normal effort.  Equal breath sounds bilaterally.  Mild expiratory wheeze bilaterally. Abd:  No distention.  Soft, nontender.  No rebound or guarding.   ED Results / Procedures / Treatments   EKG  EKG viewed and interpreted by myself shows sinus tachycardia 120 bpm with a narrow QRS, normal axis, normal intervals, nonspecific but no concerning ST changes.  RADIOLOGY  I reviewed and interpreted the chest x-ray images.  I do not see any  obvious consolidation on my evaluation. Radiology is read the chest x-ray is negative.   MEDICATIONS ORDERED IN ED: Medications - No data to display   IMPRESSION / MDM / ASSESSMENT AND PLAN / ED COURSE  I reviewed the triage vital signs and the nursing notes.  Patient's presentation is most consistent with acute presentation with potential threat to life or bodily function.  Patient presents emergency department for shortness of breath and cough x1 week.  Patient has wheeze on exam.  We will dose to DuoNebs in the emergency department.  Patient has been taking prednisone including today.  Patient also prescribed a cough medication by her doctor.  Patient symptoms sound suggestive of bronchitis with reactive airway disease.  We will obtain a chest x-ray as well as a COVID/flu swab.  Patient's chemistry shows no significant finding mild hypokalemia.  Reassuring CBC with a normal white blood cell count.  Chest x-ray negative.  COVID pending.  Patient care signed out to oncoming provider.  We will reassess after COVID results and DuoNebs.  FINAL CLINICAL IMPRESSION(S) / ED DIAGNOSES   Acute  bronchitis Reactive airway disease    Note:  This document was prepared using Dragon voice recognition software and may include unintentional dictation errors.   Harvest Dark, MD 12/31/21 [phone]

## 2022-02-15 DIAGNOSIS — Z9109 Other allergy status, other than to drugs and biological substances: Secondary | ICD-10-CM | POA: Diagnosis not present

## 2022-02-15 DIAGNOSIS — Z8639 Personal history of other endocrine, nutritional and metabolic disease: Secondary | ICD-10-CM | POA: Diagnosis not present

## 2022-02-15 DIAGNOSIS — J4550 Severe persistent asthma, uncomplicated: Secondary | ICD-10-CM | POA: Diagnosis not present

## 2022-02-15 DIAGNOSIS — Z113 Encounter for screening for infections with a predominantly sexual mode of transmission: Secondary | ICD-10-CM | POA: Diagnosis not present

## 2022-02-15 DIAGNOSIS — T781XXA Other adverse food reactions, not elsewhere classified, initial encounter: Secondary | ICD-10-CM | POA: Diagnosis not present

## 2022-02-15 DIAGNOSIS — J3089 Other allergic rhinitis: Secondary | ICD-10-CM | POA: Diagnosis not present

## 2022-02-15 DIAGNOSIS — Z6834 Body mass index (BMI) 34.0-34.9, adult: Secondary | ICD-10-CM | POA: Diagnosis not present

## 2022-02-15 DIAGNOSIS — E669 Obesity, unspecified: Secondary | ICD-10-CM | POA: Diagnosis not present

## 2022-03-13 DIAGNOSIS — Z0000 Encounter for general adult medical examination without abnormal findings: Secondary | ICD-10-CM | POA: Diagnosis not present

## 2022-03-13 DIAGNOSIS — J3089 Other allergic rhinitis: Secondary | ICD-10-CM | POA: Diagnosis not present

## 2022-03-13 DIAGNOSIS — E785 Hyperlipidemia, unspecified: Secondary | ICD-10-CM | POA: Diagnosis not present

## 2022-03-13 DIAGNOSIS — J4550 Severe persistent asthma, uncomplicated: Secondary | ICD-10-CM | POA: Diagnosis not present

## 2022-03-13 DIAGNOSIS — R790 Abnormal level of blood mineral: Secondary | ICD-10-CM | POA: Diagnosis not present

## 2022-03-20 DIAGNOSIS — R0602 Shortness of breath: Secondary | ICD-10-CM | POA: Diagnosis not present

## 2022-03-22 DIAGNOSIS — J069 Acute upper respiratory infection, unspecified: Secondary | ICD-10-CM | POA: Diagnosis not present

## 2022-03-22 DIAGNOSIS — J3089 Other allergic rhinitis: Secondary | ICD-10-CM | POA: Diagnosis not present

## 2022-03-22 DIAGNOSIS — J4551 Severe persistent asthma with (acute) exacerbation: Secondary | ICD-10-CM | POA: Diagnosis not present

## 2022-05-15 NOTE — Progress Notes (Unsigned)
NEW PATIENT Date of Service/Encounter:  05/16/22 Referring provider: Dickey Gave Primary care provider: Patient, No Pcp Per  Subjective:  Sonya Castro is a 32 y.o. female with a PMHx of anemia, anxiety, heart burn, hypothyroidism presenting today for evaluation of severe persistent asthma. History obtained from: chart review and patient.   Asthma:  Diagnosed as an adult, age 58 yo.  Living in [place] for past 2.31 yo, going to PCP frequently and being treated for recurrent bronchitis. On Breo and singulair. Duonebs PRN. Duonebs help but albuterol doesn't seem to help as long-only 3 hours max. Duonebs she is able to go 6 to 8 hours without needing another dose. She started [facility] in December, and was doing better, but the last 1.5 months, her asthma is waking her up with SOB at night, and she will do a breathing treatment which helps. Using rescue inhaler multiple times per day at work.  Triggers: fumes, scents, dog-has dog in the home. Roaches. exercise Prior allergy testing years ago + to roaches, dust mites, molds. She was on AIT, home dosing when she was 32 years old, completed 1.5 years of therapy.  She is not exercising currently due to extra heart beats heard by PCP who instructed her to stop exercise.  She has had a normal EKG, but no Holter monitor. AEC 600 on 04/21/21. She received prednisone at least 6 times in the past year.  Seen in ED 4 times for asthma/bronchitis in 2023.  Chronic rhinitis:  Ongoing nasal congestion, hyposmia. Right nostril is harder for her breath.  She can't taste and smell.  She has been having trouble with this for 3.5 months.  No ENT surgeries.  She has been told she has fluid on her ears.  Never been evaluated by ENT.   Pollen food allergy syndrome: Pineapple, watermelon-makes her itchy.  She continues to eat these fruits despite these symptoms. Only happens when raw and fresh.  Shellfish-she gets itchy all over, including her throat. She  has avoided for over one year. Does not carry an epipen.   Penicillin allergy:  Hives and sensation of throat closing. Last given in childhood.  Symptoms lasted around 2.5 days.   PCP records: 02/15/22: uncontrolled asthma, AIT when younger.   Other allergy screening: Hymenoptera allergy: no Urticaria: no Eczema:no History of recurrent infections suggestive of immunodeficency: no Vaccinations are up to date.   Past Medical History: Past Medical History:  Diagnosis Date   Hypothyroidism    Medication List:  Current Outpatient Medications  Medication Sig Dispense Refill   albuterol (PROVENTIL) (2.5 MG/3ML) 0.083% nebulizer solution Take 3 mLs (2.5 mg total) by nebulization every 4 (four) hours as needed for wheezing or shortness of breath. 75 mL 0   Albuterol-Budesonide (AIRSUPRA) 90-80 MCG/ACT AERO Inhale 2 puffs into the lungs as needed (maximum 12 puffs/day). 10.7 g 3   azelastine (ASTELIN) 0.1 % nasal spray Place 2 sprays into both nostrils 2 (two) times daily.     Budeson-Glycopyrrol-Formoterol (BREZTRI AEROSPHERE) 160-9-4.8 MCG/ACT AERO Inhale 2 puffs into the lungs in the morning and at bedtime. 1 each 3   fluticasone (FLONASE) 50 MCG/ACT nasal spray Place 2 sprays into both nostrils daily. 16 g 6   fluticasone furoate-vilanterol (BREO ELLIPTA) 100-25 MCG/ACT AEPB Inhale 1 puff into the lungs daily. 60 each 1   ipratropium-albuterol (DUONEB) 0.5-2.5 (3) MG/3ML SOLN Take 3 mLs by nebulization every 6 (six) hours as needed.     montelukast (SINGULAIR) 10 MG tablet Take  1 tablet (10 mg total) by mouth at bedtime. 90 tablet 1   Nebulizers (COMPRESSOR/NEBULIZER) MISC Inhale 1 kit into the lungs as directed.     No current facility-administered medications for this visit.   Known Allergies:  Allergies  Allergen Reactions   Amoxicillin Hives   Penicillins Hives, Itching and Swelling   Past Surgical History: History reviewed. No pertinent surgical history. Family  History: Family History  Family history unknown: Yes   Social History: Sonya Castro lives in an apartment with water damage, carpet in the bedroom, gas heating, central AC, indoor dog (pitbull), no DM protection, works at a desk x 2.5 years, exposed to dust, no HEPA filter, [place].  She vaped from Nov 2022 to July 2023. .   ROS:  All other systems negative except as noted per HPI.  Objective:  Blood pressure 118/70, pulse 84, temperature 98.2 F (36.8 C), temperature source Temporal, resp. rate 16, height 5' (1.524 m), weight 182 lb (82.6 kg), SpO2 100 %, unknown if currently breastfeeding. Body mass index is 35.54 kg/m. Physical Exam:  General Appearance:  Alert, cooperative, no distress, appears stated age  Head:  Normocephalic, without obvious abnormality, atraumatic  Eyes:  Conjunctiva clear, EOM's intact  Nose: Nares normal, hypertrophic turbinates, normal mucosa, and no visible anterior polyps  Throat: Lips, tongue normal; teeth and gums normal, normal posterior oropharynx  Neck: Supple, symmetrical  Lungs:   clear to auscultation bilaterally, Respirations unlabored, no coughing  Heart:  regular rate and rhythm and no murmur, Appears well perfused  Extremities: No edema  Skin: Skin color, texture, turgor normal, no rashes or lesions on visualized portions of skin  Neurologic: No gross deficits     Diagnostics: Spirometry:  Tracings reviewed. Her effort: Good reproducible efforts. FVC: 3.35L (pre), 2.64L  (post) FEV1: 2.26L, 94% predicted (pre), 2.10L, 87% predicted (post) FEV1/FVC ratio: 0.67 (pre), 0.80 (post) Interpretation: Spirometry consistent with mild obstructive disease with out bronchodilator response  Skin Testing: Environmental allergy panel and select foods.  Adequate controls. Results discussed with patient/family.  Airborne Adult Perc - 05/16/22 1504     Time Antigen Placed 1504    Allergen Manufacturer Lavella Hammock    Location Back     Number of Test 59    Panel 1 Select    2. Control-Histamine 1 mg/ml 3+    4. [place] 3+    5. [place] 2+    6. [place] 3+    7. [place] Blue Negative    8. Meadow Fescue Negative    9. Perennial Rye 4+    10. Sweet Vernal 4+    11. Timothy Negative    12. Cocklebur Negative    13. Burweed Marshelder Negative    14. Ragweed, short Negative    15. Ragweed, Giant Negative    16. Plantain,  English Negative    17. Lamb's Quarters Negative    18. Sheep Sorrell Negative    19. Rough Pigweed Negative    20. Marsh Elder, Rough Negative    21. Mugwort, Common 2+    22. Ash mix Negative    23. Birch mix Negative    24. Beech American Negative    25. Box, Elder Negative    26. Cedar, red Negative    27. Cottonwood, [place] Negative    28. Elm mix Negative    29. Hickory 3+    30. Maple mix Negative    31. Oak, [place] mix 3+    32. Pecan  Pollen 3+    33. Pine mix Negative    34. Sycamore Eastern 2+    35. [place], Black Pollen 3+    36. Alternaria alternata 3+    37. Cladosporium Herbarum Negative    38. Aspergillus mix Negative    39. Penicillium mix Negative    40. Bipolaris sorokiniana (Helminthosporium) Negative    41. Drechslera spicifera (Curvularia) Negative    42. Mucor plumbeus Negative    43. Fusarium moniliforme Negative    44. Aureobasidium pullulans (pullulara) Negative    45. Rhizopus oryzae Negative    46. Botrytis cinera Negative    47. Epicoccum nigrum Negative    48. Phoma betae Negative    49. Candida Albicans Negative    50. Trichophyton mentagrophytes Negative    51. Mite, D Farinae  5,000 AU/ml Negative    52. Mite, D Pteronyssinus  5,000 AU/ml Negative    53. Cat Hair 10,000 BAU/ml 3+    54.  Dog Epithelia 2+    55. Mixed Feathers Negative    56. Horse Epithelia 3+    57. Cockroach, German Negative    58. Mouse Negative    59. Tobacco Leaf Negative             Intradermal - 05/16/22 1551     Time Antigen Placed 1551    Allergen  Manufacturer Lavella Hammock    Location Arm    Number of Test 5    Intradermal Select    Control Negative    Ragweed mix 3+    Mold 2 3+    Cockroach 3+    Mite mix 3+             Food Adult Perc - 05/16/22 1500     Time Antigen Placed 1504    Allergen Manufacturer Lavella Hammock    Location Back    Number of allergen test 7    Control-Histamine 1 mg/ml 3+    25. Shrimp Negative    26. Crab Negative    27. Lobster Negative    28. Oyster Negative    29. Scallops Negative    63. Pineapple Negative             Allergy testing results were read and interpreted by myself, documented by clinical staff.  Assessment and Plan  Severe Persistent Asthma: - your lung testing today showed mild obstruction with out significant improvement - Controller Inhaler: Start Breztri 160 mcg 2 puffs twice a day; This Should Be Used Everyday This will replace Breo.  - Rinse mouth out after use - use with a spacer -Singulair (montelukast) continue 10 mg daily - stop if nightmares of mood changes. - Rescue Inhaler: AirSupra 2-4 puffs as needed (max 12 puffs/day) or  1 vial albuterol Use  every 4-6 hours as needed for chest tightness, wheezing, or coughing.  Can also use 15 minutes prior to exercise if you have symptoms with activity. Or 1 vial duonebs - every 6 to 8 hours as needed - Asthma is not controlled if:  - Symptoms are occurring >2 times a week OR  - >2 times a month nighttime awakenings  - You are requiring systemic steroids (prednisone/steroid injections) more than once per year  - Your require hospitalization for your asthma.  - Please call the clinic to schedule a follow up if these symptoms arise  -labs today to see whether you qualify for injectable asthma medications  Chronic Rhinitis: Seasonal and Perennial Allergic: - allergy testing today:  skin testing positive to grass pollen, weed pollen, tree pollen, outdoor molds, cat, dog and horse Intradermal testing was positive to cockroach,  mold mix 2 (indoor molds), dust mites and ragweed mix  - Prevention:  - allergen avoidance when possible - consider allergy shots as long term control of your symptoms by teaching your immune system to be more tolerant of your allergy triggers  - asthma must first be controlled  - Symptom control: First use saline rinses twice daily then use flonase and astelin. - Continue Nasal Steroid Spray: Best results if used daily. - Options include Flonase (fluticasone), Nasocort (triamcinolone), Nasonex (mometasome) 1- 2 sprays in each nostril daily.  - All can be purchased over-the-counter if not covered by insurance. - Continue Astelin (azelastine) 1-2 sprays in each nostril twice a day as needed for nasal congestion/itchy nose - Continue Singulair (Montelukast) 10mg  nightly.   - Discontinue if nightmares of behavior changes. - Continue Antihistamine: daily or daily as needed.   -Options include Zyrtec (Cetirizine) 10mg , Claritin (Loratadine) 10mg , Allegra (Fexofenadine) 180mg , or Xyzal (Levocetirinze) 5mg  - Can be purchased over-the-counter if not covered by insurance.  Allergic Conjunctivitis:  - Consider Allergy Eye drops-great options include Pataday (Olopatadine) or Zaditor (ketotifen) for eye symptoms daily as needed-both sold over the counter if not covered by insurance.  -Avoid eye drops that say red eye relief as they may contain medications that dry out your eyes.  Food allergy:  - today's skin testing was negative to shellfish - labs today shellfish - please strictly avoid shellfish - for SKIN only reaction, okay to take Benadryl 2 capsules every 4 hours - for SKIN + ANY additional symptoms, OR IF concern for LIFE THREATENING reaction = Epipen Autoinjector EpiPen 0.3 mg. - If using Epinephrine autoinjector, call 911 - A food allergy action plan has been provided and discussed. - Medic Alert identification is recommended. - allergy list updated  Oral Allergy Syndrome (watermelon,  pineapple): Avoid all raw fruits and vegetables that bother you. Allergy injections may improve these symptoms. - These symptoms are typically not life-threatening and are because of a cross reaction between a pollen you are allergic to, and to a protein in specific foods (such as fresh fruits, vegetables, and nuts). - If you can eat these things and tolerate the symptoms, it is fine to continue to do so.  If not, you may avoid these fresh fruits and vegetables.   - Heating these foods, buying them canned, and peeling these foods should allow them to be consumed without symptoms or with less symptoms.  H/O Penicillin allergy: Asthma must first be controlled. - please schedule follow-up appt at your convenience for penicillin testing followed by graded oral challenge if indicated - please refrain from taking any antihistamines at least 3 days prior to this appointment  - around 80% of individuals outgrow this allergy in ~ 10 years and carrying it as a diagnosis can prevent you from getting proper therapy if needed   Follow up : 4 to 6 weeks, sooner if needed It was a pleasure meeting you in clinic today! Thank you for allowing me to participate in your care.  This note in its entirety was forwarded to the Provider who requested this consultation.  Thank you for your kind referral. I appreciate the opportunity to take part in [place] care. Please do not hesitate to contact me with questions.  Sincerely,  Sigurd Sos, MD [facility] [place]

## 2022-05-16 ENCOUNTER — Ambulatory Visit (INDEPENDENT_AMBULATORY_CARE_PROVIDER_SITE_OTHER): Payer: BC Managed Care – PPO | Admitting: Internal Medicine

## 2022-05-16 ENCOUNTER — Other Ambulatory Visit: Payer: Self-pay

## 2022-05-16 ENCOUNTER — Encounter: Payer: Self-pay | Admitting: Internal Medicine

## 2022-05-16 VITALS — BP 118/70 | HR 84 | Temp 98.2°F | Resp 16 | Ht 60.0 in | Wt 182.0 lb

## 2022-05-16 DIAGNOSIS — J3089 Other allergic rhinitis: Secondary | ICD-10-CM | POA: Diagnosis not present

## 2022-05-16 DIAGNOSIS — H1013 Acute atopic conjunctivitis, bilateral: Secondary | ICD-10-CM

## 2022-05-16 DIAGNOSIS — Z880 Allergy status to penicillin: Secondary | ICD-10-CM

## 2022-05-16 DIAGNOSIS — J4550 Severe persistent asthma, uncomplicated: Secondary | ICD-10-CM | POA: Diagnosis not present

## 2022-05-16 DIAGNOSIS — T781XXA Other adverse food reactions, not elsewhere classified, initial encounter: Secondary | ICD-10-CM | POA: Diagnosis not present

## 2022-05-16 DIAGNOSIS — J302 Other seasonal allergic rhinitis: Secondary | ICD-10-CM

## 2022-05-16 DIAGNOSIS — T7819XA Other adverse food reactions, not elsewhere classified, initial encounter: Secondary | ICD-10-CM

## 2022-05-16 HISTORY — DX: Other seasonal allergic rhinitis: J30.2

## 2022-05-16 HISTORY — DX: Other adverse food reactions, not elsewhere classified, initial encounter: T78.1XXA

## 2022-05-16 MED ORDER — BREZTRI AEROSPHERE 160-9-4.8 MCG/ACT IN AERO: 2.0000 | INHALATION_SPRAY | Freq: Two times a day (BID) | RESPIRATORY_TRACT | 3 refills | Status: DC

## 2022-05-16 MED ORDER — AIRSUPRA 90-80 MCG/ACT IN AERO: 2.0000 | INHALATION_SPRAY | RESPIRATORY_TRACT | 3 refills | Status: DC | PRN

## 2022-05-16 MED ORDER — EPINEPHRINE 0.3 MG/0.3ML IJ SOAJ: 0.3000 mg | INTRAMUSCULAR | 2 refills | Status: DC | PRN

## 2022-05-16 NOTE — Patient Instructions (Signed)
Severe Persistent Asthma: - your lung testing today showed mild obstruction with out significant improvement - Controller Inhaler: Start Breztri 160 mcg 2 puffs twice a day; This Should Be Used Everyday This will replace Breo.  - Rinse mouth out after use - use with a spacer -Singulair (montelukast) continue 10 mg daily - stop if nightmares of mood changes. - Rescue Inhaler:  AirSupra  2-4 puffs as needed (max 12 puffs/day) or  1 vial albuterol Use  every 4-6 hours as needed for chest tightness, wheezing, or coughing.  Can also use 15 minutes prior to exercise if you have symptoms with activity. Or 1 vial duonebs - every 6 to 8 hours as needed - Asthma is not controlled if:  - Symptoms are occurring >2 times a week OR  - >2 times a month nighttime awakenings  - You are requiring systemic steroids (prednisone/steroid injections) more than once per year  - Your require hospitalization for your asthma.  - Please call the clinic to schedule a follow up if these symptoms arise  -labs today to see whether you qualify for injectable asthma medications  Chronic Rhinitis Seasonal and Perennial Allergic: - allergy testing today: skin testing positive to grass pollen, weed pollen, tree pollen, outdoor molds, cat, dog and horse Intradermal testing was positive to cockroach, mold mix 2 (indoor molds), dust mites and ragweed mix  - Prevention:  - allergen avoidance when possible - consider allergy shots as long term control of your symptoms by teaching your immune system to be more tolerant of your allergy triggers  - asthma must first be controlled  - Symptom control: First use saline rinses twice daily then use flonase and astelin. - Continue Nasal Steroid Spray: Best results if used daily. - Options include Flonase (fluticasone), Nasocort (triamcinolone), Nasonex (mometasome) 1- 2 sprays in each nostril daily.  - All can be purchased over-the-counter if not covered by insurance. - Continue  Astelin (azelastine) 1-2 sprays in each nostril twice a day as needed for nasal congestion/itchy nose - Continue Singulair (Montelukast) 10mg  nightly.   - Discontinue if nightmares of behavior changes. - Continue Antihistamine: daily or daily as needed.   -Options include Zyrtec (Cetirizine) 10mg , Claritin (Loratadine) 10mg , Allegra (Fexofenadine) 180mg , or Xyzal (Levocetirinze) 5mg  - Can be purchased over-the-counter if not covered by insurance.  Allergic Conjunctivitis:  - Consider Allergy Eye drops-great options include Pataday (Olopatadine) or Zaditor (ketotifen) for eye symptoms daily as needed-both sold over the counter if not covered by insurance.  -Avoid eye drops that say red eye relief as they may contain medications that dry out your eyes.  Food allergy:  - today's skin testing was negative to shellfish - labs today shellfish - please strictly avoid shellfish - for SKIN only reaction, okay to take Benadryl 2 capsules every 4 hours - for SKIN + ANY additional symptoms, OR IF concern for LIFE THREATENING reaction = Epipen Autoinjector EpiPen 0.3 mg. - If using Epinephrine autoinjector, call 911 - A food allergy action plan has been provided and discussed. - Medic Alert identification is recommended.  Oral Allergy Syndrome (watermelon, pineapple): Avoid all raw fruits and vegetables that bother you. Allergy injections may improve these symptoms. - These symptoms are typically not life-threatening and are because of a cross reaction between a pollen you are allergic to, and to a protein in specific foods (such as fresh fruits, vegetables, and nuts). - If you can eat these things and tolerate the symptoms, it is fine to continue to do  so.  If not, you may avoid these fresh fruits and vegetables.   - Heating these foods, buying them canned, and peeling these foods should allow them to be consumed without symptoms or with less symptoms.  H/O Penicillin allergy: Asthma must first be  controlled. - please schedule follow-up appt at your convenience for penicillin testing followed by graded oral challenge if indicated - please refrain from taking any antihistamines at least 3 days prior to this appointment  - around 80% of individuals outgrow this allergy in ~ 10 years and carrying it as a diagnosis can prevent you from getting proper therapy if needed   Follow up : 4 to 6 weeks, sooner if needed It was a pleasure meeting you in clinic today! Thank you for allowing me to participate in your care.  Sigurd Sos, MD [facility]  Reducing Pollen Exposure  The American Academy of Allergy, Asthma and Immunology suggests the following steps to reduce your exposure to pollen during allergy seasons.    Do not hang sheets or clothing out to dry; pollen may collect on these items. Do not mow lawns or spend time around freshly cut grass; mowing stirs up pollen. Keep windows closed at night.  Keep car windows closed while driving. Minimize morning activities outdoors, a time when pollen counts are usually at their highest. Stay indoors as much as possible when pollen counts or humidity is high and on windy days when pollen tends to remain in the air longer. Use air conditioning when possible.  Many air conditioners have filters that trap the pollen spores. Use a HEPA room air filter to remove pollen form the indoor air you breathe. Control of Dog or Cat Allergen  Avoidance is the best way to manage a dog or cat allergy. If you have a dog or cat and are allergic to dog or cats, consider removing the dog or cat from the home. If you have a dog or cat but don't want to find it a new home, or if your family wants a pet even though someone in the household is allergic, here are some strategies that may help keep symptoms at bay:  Keep the pet out of your bedroom and restrict it to only a few rooms. Be advised that keeping the dog or cat in only one room will not  limit the allergens to that room. Don't pet, hug or kiss the dog or cat; if you do, wash your hands with soap and water. High-efficiency particulate air (HEPA) cleaners run continuously in a bedroom or living room can reduce allergen levels over time. Regular use of a high-efficiency vacuum cleaner or a central vacuum can reduce allergen levels. Giving your dog or cat a bath at least once a week can reduce airborne allergen. Control of Mold Allergen   Mold and fungi can grow on a variety of surfaces provided certain temperature and moisture conditions exist.  Outdoor molds grow on plants, decaying vegetation and soil.  The major outdoor mold, Alternaria and Cladosporium, are found in very high numbers during hot and dry conditions.  Generally, a late Summer - Fall peak is seen for common outdoor fungal spores.  Rain will temporarily lower outdoor mold spore count, but counts rise rapidly when the rainy period ends.  The most important indoor molds are Aspergillus and Penicillium.  Dark, humid and poorly ventilated basements are ideal sites for mold growth.  The next most common sites of mold growth are the  bathroom and the kitchen.  Outdoor (Seasonal) Mold Control  Use air conditioning and keep windows closed Avoid exposure to decaying vegetation. Avoid leaf raking. Avoid grain handling. Consider wearing a face mask if working in moldy areas.    Indoor (Perennial) Mold Control   Maintain humidity below 50%. Clean washable surfaces with 5% bleach solution. Remove sources e.g. contaminated carpets. DUST MITE AVOIDANCE MEASURES:  There are three main measures that need and can be taken to avoid house dust mites:  Reduce accumulation of dust in general -reduce furniture, clothing, carpeting, books, stuffed animals, especially in bedroom  Separate yourself from the dust -use pillow and mattress encasements (can be found at stores such as [employer] or online) -avoid direct  exposure to air condition flow -use a HEPA filter device, especially in the bedroom; you can also use a HEPA filter vacuum cleaner -wipe dust with a moist towel instead of a dry towel or broom when cleaning  Decrease mites and/or their secretions -wash clothing and linen and stuffed animals at highest temperature possible, at least every 2 weeks -stuffed animals can also be placed in a bag and put in a freezer overnight  Despite the above measures, it is impossible to eliminate dust mites or their allergen completely from your home.  With the above measures the burden of mites in your home can be diminished, with the goal of minimizing your allergic symptoms.  Success will be reached only when implementing and using all means together.

## 2022-05-20 LAB — CBC WITH DIFFERENTIAL/PLATELET
Basophils Absolute: 0.1 10*3/uL (ref 0.0–0.2)
Basos: 1 %
EOS (ABSOLUTE): 0.4 10*3/uL (ref 0.0–0.4)
Eos: 4 %
Hematocrit: 36.9 % (ref 34.0–46.6)
Hemoglobin: 12.1 g/dL (ref 11.1–15.9)
Immature Grans (Abs): 0 10*3/uL (ref 0.0–0.1)
Immature Granulocytes: 0 %
Lymphocytes Absolute: 3.2 10*3/uL — ABNORMAL HIGH (ref 0.7–3.1)
Lymphs: 32 %
MCH: 26.9 pg (ref 26.6–33.0)
MCHC: 32.8 g/dL (ref 31.5–35.7)
MCV: 82 fL (ref 79–97)
Monocytes Absolute: 0.4 10*3/uL (ref 0.1–0.9)
Monocytes: 4 %
Neutrophils Absolute: 5.9 10*3/uL (ref 1.4–7.0)
Neutrophils: 59 %
Platelets: 344 10*3/uL (ref 150–450)
RBC: 4.49 x10E6/uL (ref 3.77–5.28)
RDW: 13 % (ref 11.7–15.4)
WBC: 10 10*3/uL (ref 3.4–10.8)

## 2022-05-20 LAB — ALLERGEN PROFILE, SHELLFISH
Clam IgE: 0.26 kU/L — AB
F023-IgE Crab: 0.31 kU/L — AB
F080-IgE Lobster: 0.16 kU/L — AB
F290-IgE Oyster: 0.24 kU/L — AB
Scallop IgE: 0.3 kU/L — AB
Shrimp IgE: 4.62 kU/L — AB

## 2022-05-20 LAB — IGE: IgE (Immunoglobulin E), Serum: 2410 IU/mL — ABNORMAL HIGH (ref 6–495)

## 2022-05-21 NOTE — Progress Notes (Signed)
Please let Sonya Castro know that she is allergic to shellfish and should avoid and carry an epinphrine autoinjector.  She also has an elevated IgE antibody (allergic antibody) and eosinophil count (inflammatory blood cells)-this is nonspecific findings, but will qualify her for an injectable asthma medication if she does not achieve control with her current medications.  I would like to see her back in the next 4-8 weeks.

## 2022-05-23 NOTE — Progress Notes (Signed)
Thank you Sonya Castro!

## 2022-06-04 ENCOUNTER — Ambulatory Visit (INDEPENDENT_AMBULATORY_CARE_PROVIDER_SITE_OTHER): Payer: BC Managed Care – PPO | Admitting: Internal Medicine

## 2022-06-04 ENCOUNTER — Encounter: Payer: Self-pay | Admitting: Internal Medicine

## 2022-06-04 DIAGNOSIS — J302 Other seasonal allergic rhinitis: Secondary | ICD-10-CM

## 2022-06-04 DIAGNOSIS — J3089 Other allergic rhinitis: Secondary | ICD-10-CM

## 2022-06-04 DIAGNOSIS — J4551 Severe persistent asthma with (acute) exacerbation: Secondary | ICD-10-CM

## 2022-06-04 DIAGNOSIS — T781XXA Other adverse food reactions, not elsewhere classified, initial encounter: Secondary | ICD-10-CM

## 2022-06-04 DIAGNOSIS — Z880 Allergy status to penicillin: Secondary | ICD-10-CM | POA: Diagnosis not present

## 2022-06-04 DIAGNOSIS — J4550 Severe persistent asthma, uncomplicated: Secondary | ICD-10-CM

## 2022-06-04 DIAGNOSIS — T781XXD Other adverse food reactions, not elsewhere classified, subsequent encounter: Secondary | ICD-10-CM | POA: Diagnosis not present

## 2022-06-04 DIAGNOSIS — H1013 Acute atopic conjunctivitis, bilateral: Secondary | ICD-10-CM

## 2022-06-04 MED ORDER — IPRATROPIUM-ALBUTEROL 0.5-2.5 (3) MG/3ML IN SOLN: 3.0000 mL | Freq: Four times a day (QID) | RESPIRATORY_TRACT | 2 refills | Status: DC | PRN

## 2022-06-04 MED ORDER — PREDNISONE 20 MG PO TABS: 40.0000 mg | ORAL_TABLET | Freq: Every day | ORAL | 0 refills | Status: AC

## 2022-06-04 NOTE — Patient Instructions (Addendum)
Severe Persistent Asthma: - Controller Inhaler: Continue Breztri 160 mcg 2 puffs twice a day; This Should Be Used Everyday - Rinse mouth out after use - use with a spacer -Singulair (montelukast) continue 10 mg daily - stop if nightmares of mood changes. - Rescue Inhaler:  AirSupra  2-4 puffs as needed (max 12 puffs/day) or  1 vial albuterol Use  every 4-6 hours as needed for chest tightness, wheezing, or coughing.  Can also use 15 minutes prior to exercise if you have symptoms with activity. Or 1 vial duonebs - every 6 to 8 hours as needed - Asthma is not controlled if:  - Symptoms are occurring >2 times a week OR  - >2 times a month nighttime awakenings  - You are requiring systemic steroids (prednisone/steroid injections) more than once per year  - Your require hospitalization for your asthma.  - Please call the clinic to schedule a follow up if these symptoms arise  -we will submit for dupixent injections for asthma - we will send in prednisone 40 mg daily for 5 days  Chronic Rhinitis Seasonal and Perennial Allergic: - allergen avoidance towards grass pollen, weed pollen, tree pollen, outdoor molds, cat, dog and horse Intradermal testing was positive to cockroach, mold mix 2 (indoor molds), dust mites and ragweed mix  - Prevention:  - allergen avoidance when possible - consider allergy shots as long term control of your symptoms by teaching your immune system to be more tolerant of your allergy triggers  - asthma must first be controlled  - Symptom control: First use saline rinses twice daily then use flonase and astelin. -  Discontinue  Nasal Steroid Spray - All can be purchased over-the-counter if not covered by insurance. -  Discontinue  Astelin (azelastine)  - Continue Singulair (Montelukast) '10mg'$  nightly.   - Discontinue if nightmares of behavior changes. - Continue Antihistamine: daily or daily as needed.   -Options include Zyrtec (Cetirizine) '10mg'$ , Claritin (Loratadine)  '10mg'$ , Allegra (Fexofenadine) '180mg'$ , or Xyzal (Levocetirinze) '5mg'$  - Can be purchased over-the-counter if not covered by insurance.  Allergic Conjunctivitis:  - Consider Allergy Eye drops-great options include Pataday (Olopatadine) or Zaditor (ketotifen) for eye symptoms daily as needed-both sold over the counter if not covered by insurance.  -Avoid eye drops that say red eye relief as they may contain medications that dry out your eyes.  Food allergy:  - please strictly avoid shellfish and peanuts and tree nuts for now - will order labs today - for SKIN only reaction, okay to take Benadryl 2 capsules every 4 hours - for SKIN + ANY additional symptoms, OR IF concern for LIFE THREATENING reaction = Epipen Autoinjector EpiPen 0.3 mg. - If using Epinephrine autoinjector, call 911 - A food allergy action plan has been provided and discussed. - Medic Alert identification is recommended.  Oral Allergy Syndrome (watermelon, pineapple): Avoid all raw fruits and vegetables that bother you. Allergy injections may improve these symptoms. - These symptoms are typically not life-threatening and are because of a cross reaction between a pollen you are allergic to, and to a protein in specific foods (such as fresh fruits, vegetables, and nuts). - If you can eat these things and tolerate the symptoms, it is fine to continue to do so.  If not, you may avoid these fresh fruits and vegetables.   - Heating these foods, buying them canned, and peeling these foods should allow them to be consumed without symptoms or with less symptoms.  H/O Penicillin allergy: Asthma must  first be controlled. - please schedule follow-up appt at your convenience for penicillin testing followed by graded oral challenge if indicated - please refrain from taking any antihistamines at least 3 days prior to this appointment  - around 80% of individuals outgrow this allergy in ~ 10 years and carrying it as a diagnosis can prevent you from  getting proper therapy if needed   Follow up : March 13th Thank you for allowing me to participate in your care.  Sigurd Sos, MD [facility]  Reducing Pollen Exposure  The American Academy of Allergy, Asthma and Immunology suggests the following steps to reduce your exposure to pollen during allergy seasons.    Do not hang sheets or clothing out to dry; pollen may collect on these items. Do not mow lawns or spend time around freshly cut grass; mowing stirs up pollen. Keep windows closed at night.  Keep car windows closed while driving. Minimize morning activities outdoors, a time when pollen counts are usually at their highest. Stay indoors as much as possible when pollen counts or humidity is high and on windy days when pollen tends to remain in the air longer. Use air conditioning when possible.  Many air conditioners have filters that trap the pollen spores. Use a HEPA room air filter to remove pollen form the indoor air you breathe. Control of Dog or Cat Allergen  Avoidance is the best way to manage a dog or cat allergy. If you have a dog or cat and are allergic to dog or cats, consider removing the dog or cat from the home. If you have a dog or cat but don't want to find it a new home, or if your family wants a pet even though someone in the household is allergic, here are some strategies that may help keep symptoms at bay:  Keep the pet out of your bedroom and restrict it to only a few rooms. Be advised that keeping the dog or cat in only one room will not limit the allergens to that room. Don't pet, hug or kiss the dog or cat; if you do, wash your hands with soap and water. High-efficiency particulate air (HEPA) cleaners run continuously in a bedroom or living room can reduce allergen levels over time. Regular use of a high-efficiency vacuum cleaner or a central vacuum can reduce allergen levels. Giving your dog or cat a bath at least once a week can reduce  airborne allergen. Control of Mold Allergen   Mold and fungi can grow on a variety of surfaces provided certain temperature and moisture conditions exist.  Outdoor molds grow on plants, decaying vegetation and soil.  The major outdoor mold, Alternaria and Cladosporium, are found in very high numbers during hot and dry conditions.  Generally, a late Summer - Fall peak is seen for common outdoor fungal spores.  Rain will temporarily lower outdoor mold spore count, but counts rise rapidly when the rainy period ends.  The most important indoor molds are Aspergillus and Penicillium.  Dark, humid and poorly ventilated basements are ideal sites for mold growth.  The next most common sites of mold growth are the bathroom and the kitchen.  Outdoor (Seasonal) Mold Control  Use air conditioning and keep windows closed Avoid exposure to decaying vegetation. Avoid leaf raking. Avoid grain handling. Consider wearing a face mask if working in moldy areas.    Indoor (Perennial) Mold Control   Maintain humidity below 50%. Clean washable surfaces with 5% bleach  solution. Remove sources e.g. contaminated carpets. DUST MITE AVOIDANCE MEASURES:  There are three main measures that need and can be taken to avoid house dust mites:  Reduce accumulation of dust in general -reduce furniture, clothing, carpeting, books, stuffed animals, especially in bedroom  Separate yourself from the dust -use pillow and mattress encasements (can be found at stores such as [employer] or online) -avoid direct exposure to air condition flow -use a HEPA filter device, especially in the bedroom; you can also use a HEPA filter vacuum cleaner -wipe dust with a moist towel instead of a dry towel or broom when cleaning  Decrease mites and/or their secretions -wash clothing and linen and stuffed animals at highest temperature possible, at least every 2 weeks -stuffed animals can also be placed in a bag and put in a freezer  overnight  Despite the above measures, it is impossible to eliminate dust mites or their allergen completely from your home.  With the above measures the burden of mites in your home can be diminished, with the goal of minimizing your allergic symptoms.  Success will be reached only when implementing and using all means together.

## 2022-06-04 NOTE — Progress Notes (Signed)
RE: Sonya Castro MRN: [id number] DOB: 01/11/1991 Date of Telemedicine Visit: 06/04/2022  Referring provider: Gean Birchwood Primary care provider: Wendall Mola, PA-C  Chief Complaint: Asthma (1.5 weeks flared every time she goes into the office )   Telemedicine Follow Up Visit via Telephone: I connected with Sonya Castro for a follow up on 06/04/22 by telephone and verified that I am speaking with the correct person using two identifiers.   I discussed the limitations, risks, security and privacy concerns of performing an evaluation and management service by telephone and the availability of in person appointments. I also discussed with the patient that there may be a patient responsible charge related to this service. The patient expressed understanding and agreed to proceed.  Patient is at home accompanied by no one. Provider is at the office.  Visit start time: 10:45 AM Visit end time: 11:20 AM Insurance consent/check in by: Augusto Garbe Medical consent and medical assistant/nurse: Katherina Right, CMA  History of Present Illness: She is a 32 y.o. female, who is being followed for asthma, allergic rhinitis, shellfish allergy, pollen food allergy syndrome. Her previous allergy office visit was on 05/16/22 with  Dr. Simona Huh .   That was her first visit.   She did start Breztri since that visit.  She is using multiple air purifiers in her home. She Deep cleaned her carpets and couches. Got rid of her dog.  This has helped with her symptoms at home. However, when she is in at work, she gets chest tightness, and wheezing.  This is occurring daily. She is having to bring her nebulizer machine into work with her.  Her ACT score was a 9/25 today. She is working in a basement with known mold. She feels she needs a steroid course to help her currently so that she can breath. She is using her rescue inhaler 3-4 times per day since we saw her. She is unable to walk from her car to  work without getting SOB.   She is using azelastine and flonase.  She is having some nosebleeds.    She recently ate a snickers and had tongue swelling and itching, happened immediately after. She had eaten a smoothie one hour before which had mango and strawberries.  She does have geographic tongue.  She has avoided all nuts since this occurred.   Pertinent History/Diagnostics:  Asthma: Diagnosed as an adult, age 25 yo. Using rescue inhaler multiple times per day at work.  Triggers: fumes, scents, dog-has dog in the home. Roaches. exercise  AEC 600 on 04/21/21. She received prednisone at least 6 times in the past year.  Seen in ED 4 times for asthma/bronchitis in 2023. -  spirometry (05/16/22): ratio 0.67, 94% FEV1 (pre), 87% FEV1 (post)-mild obstruction without postbronchodilator response - labs: 05/16/22: IgE 2410, AEC 400 Allergic Rhinitis:  Ongoing nasal congestion, hyposmia. Right nostril is harder for her breath.  She can't taste and smell.  She has been having trouble with this for 3.5 months.  No ENT surgeries.  - SPT environmental panel (05/16/22):  skin testing positive to grass pollen, weed pollen, tree pollen, outdoor molds, cat, dog and horse Intradermal testing was positive to cockroach, mold mix 2 (indoor molds), dust Pollen food allergy syndrome: Pineapple, watermelon-makes her itchy.  She continues to eat these fruits despite these symptoms. Only happens when raw and fresh. Shellfish- she gets itchy all over, including her throat. She has avoided for over one year. Does not carry an epipen.  -  SPT negative to shellfish 05/16/22. - labs 05/16/22: positive to shrimp 4.62, crab 0.31, clam 0.26, scallop 0.30, oyster 0.24, lobster 0.16 Penicillin allergy:  Hives and sensation of throat closing. Last given in childhood.  Symptoms lasted around 2.5 days.  Assessment and Plan: Sonya Castro is a 32 y.o. female with: Severe Persistent Asthma: not controlled - Controller Inhaler:  Continue Breztri 160 mcg 2 puffs twice a day; This Should Be Used Everyday - Rinse mouth out after use - use with a spacer -Singulair (montelukast) continue 10 mg daily - stop if nightmares of mood changes. - Rescue Inhaler: AirSupra 2-4 puffs as needed (max 12 puffs/day) or  1 vial albuterol Use  every 4-6 hours as needed for chest tightness, wheezing, or coughing.  Can also use 15 minutes prior to exercise if you have symptoms with activity. Or 1 vial duonebs - every 6 to 8 hours as needed - Asthma is not controlled if:  - Symptoms are occurring >2 times a week OR  - >2 times a month nighttime awakenings  - You are requiring systemic steroids (prednisone/steroid injections) more than once per year  - Your require hospitalization for your asthma.  - Please call the clinic to schedule a follow up if these symptoms arise  -we will submit for dupixent injections for asthma - we will send in prednisone 40 mg daily for 5 days - we will send a letter requesting different accommodations for work  Chronic Rhinitis: Seasonal and Perennial Allergic: not controlled - allergen avoidance towards grass pollen, weed pollen, tree pollen, outdoor molds, cat, dog and horse Intradermal testing was positive to cockroach, mold mix 2 (indoor molds), dust mites and ragweed mix  - Prevention:  - allergen avoidance when possible - consider allergy shots as long term control of your symptoms by teaching your immune system to be more tolerant of your allergy triggers  - asthma must first be controlled  - Symptom control: First use saline rinses twice daily then use flonase and astelin. - Discontinue Nasal Steroid Spray - Discontinue Astelin (azelastine - Continue Singulair (Montelukast) '10mg'$  nightly.   - Discontinue if nightmares of behavior changes. - Continue Antihistamine: daily or daily as needed.   -Options include Zyrtec (Cetirizine) '10mg'$ , Claritin (Loratadine) '10mg'$ , Allegra (Fexofenadine) '180mg'$ , or  Xyzal (Levocetirinze) '5mg'$  - Can be purchased over-the-counter if not covered by insurance.  Allergic Conjunctivitis: stable - Consider Allergy Eye drops-great options include Pataday (Olopatadine) or Zaditor (ketotifen) for eye symptoms daily as needed-both sold over the counter if not covered by insurance.  -Avoid eye drops that say red eye relief as they may contain medications that dry out your eyes.  Food allergy: stable, possible new allergy vs OAS flare from raw fruit in smoothie - please strictly avoid shellfish and peanuts and tree nuts for now - will order labs today - for SKIN only reaction, okay to take Benadryl 2 capsules every 4 hours - for SKIN + ANY additional symptoms, OR IF concern for LIFE THREATENING reaction = Epipen Autoinjector EpiPen 0.3 mg. - If using Epinephrine autoinjector, call 911 - A food allergy action plan has been provided and discussed. - Medic Alert identification is recommended.  Oral Allergy Syndrome (watermelon, pineapple): Avoid all raw fruits and vegetables that bother you. Allergy injections may improve these symptoms. - These symptoms are typically not life-threatening and are because of a cross reaction between a pollen you are allergic to, and to a protein in specific foods (such as fresh fruits, vegetables, and nuts). -  If you can eat these things and tolerate the symptoms, it is fine to continue to do so.  If not, you may avoid these fresh fruits and vegetables.   - Heating these foods, buying them canned, and peeling these foods should allow them to be consumed without symptoms or with less symptoms.  H/O Penicillin allergy: Asthma must first be controlled. - please schedule follow-up appt at your convenience for penicillin testing followed by graded oral challenge if indicated - please refrain from taking any antihistamines at least 3 days prior to this appointment  - around 80% of individuals outgrow this allergy in ~ 10 years and carrying it  as a diagnosis can prevent you from getting proper therapy if needed   Follow up : March 13th Thank you for allowing me to participate in your care.  Lab Orders  No laboratory test(s) ordered today    Diagnostics: None.  Medication List:  Current Outpatient Medications  Medication Sig Dispense Refill   albuterol (PROVENTIL) (2.5 MG/3ML) 0.083% nebulizer solution Take 3 mLs (2.5 mg total) by nebulization every 4 (four) hours as needed for wheezing or shortness of breath. 75 mL 0   Albuterol-Budesonide (AIRSUPRA) 90-80 MCG/ACT AERO Inhale 2 puffs into the lungs as needed (maximum 12 puffs/day). 10.7 g 3   azelastine (ASTELIN) 0.1 % nasal spray Place 2 sprays into both nostrils 2 (two) times daily.     Budeson-Glycopyrrol-Formoterol (BREZTRI AEROSPHERE) 160-9-4.8 MCG/ACT AERO Inhale 2 puffs into the lungs in the morning and at bedtime. 1 each 3   EPINEPHrine (EPIPEN 2-PAK) 0.3 mg/0.3 mL IJ SOAJ injection Inject 0.3 mg into the muscle as needed for anaphylaxis. 2 each 2   fluticasone (FLONASE) 50 MCG/ACT nasal spray Place 2 sprays into both nostrils daily. 16 g 6   montelukast (SINGULAIR) 10 MG tablet Take 1 tablet (10 mg total) by mouth at bedtime. 90 tablet 1   Nebulizers (COMPRESSOR/NEBULIZER) MISC Inhale 1 kit into the lungs as directed.     fluticasone furoate-vilanterol (BREO ELLIPTA) 100-25 MCG/ACT AEPB Inhale 1 puff into the lungs daily. (Patient not taking: Reported on 06/04/2022) 60 each 1   ipratropium-albuterol (DUONEB) 0.5-2.5 (3) MG/3ML SOLN Take 3 mLs by nebulization every 6 (six) hours as needed. (Patient not taking: Reported on 06/04/2022)     No current facility-administered medications for this visit.   Allergies: Allergies  Allergen Reactions   Amoxicillin Hives   Penicillins Hives, Itching and Swelling   Shellfish Allergy Itching   I reviewed her past medical history, social history, family history, and environmental history and no significant changes have been  reported from previous visit.  Review of Systems-negative except as per HPI Objective: Physical Exam Not obtained as encounter was done via telephone.   Previous notes and tests were reviewed.  I discussed the assessment and treatment plan with the patient. The patient was provided an opportunity to ask questions and all were answered. The patient agreed with the plan and demonstrated an understanding of the instructions.   The patient was advised to call back or seek an in-person evaluation if the symptoms worsen or if the condition fails to improve as anticipated.  I provided 16 minutes of non-face-to-face time during this encounter.  It was my pleasure to participate in Sonya Castro's care today. Please feel free to contact me with any questions or concerns.   Sincerely,  Clemon Chambers, MD

## 2022-06-05 DIAGNOSIS — T781XXD Other adverse food reactions, not elsewhere classified, subsequent encounter: Secondary | ICD-10-CM | POA: Diagnosis not present

## 2022-06-08 LAB — IGE NUT PROF. W/COMPONENT RFLX

## 2022-06-10 LAB — IGE NUT PROF. W/COMPONENT RFLX
F017-IgE Hazelnut (Filbert): 18 kU/L — AB
F018-IgE Brazil Nut: 0.24 kU/L — AB
F020-IgE Almond: 3.94 kU/L — AB
F202-IgE Cashew Nut: 0.37 kU/L — AB
F203-IgE Pistachio Nut: 1.48 kU/L — AB
F256-IgE Walnut: 1.56 kU/L — AB
Macadamia Nut, IgE: 1.69 kU/L — AB
Peanut, IgE: 8.89 kU/L — AB
Pecan Nut IgE: 0.42 kU/L — AB

## 2022-06-10 LAB — PANEL 604726
Cor A 1 IgE: 23.5 kU/L — AB
Cor A 14 IgE: <0.1 kU/L
Cor A 8 IgE: 0.12 kU/L — AB
Cor A 9 IgE: 0.24 kU/L — AB

## 2022-06-10 LAB — PEANUT COMPONENTS
F352-IgE Ara h 8: 13.4 kU/L — AB
F422-IgE Ara h 1: <0.1 kU/L
F423-IgE Ara h 2: 0.13 kU/L — AB
F424-IgE Ara h 3: <0.1 kU/L
F427-IgE Ara h 9: 2.53 kU/L — AB
F447-IgE Ara h 6: <0.1 kU/L

## 2022-06-10 LAB — PANEL 604350: Ber E 1 IgE: <0.1 kU/L

## 2022-06-10 LAB — ALLERGEN COMPONENT COMMENTS

## 2022-06-10 LAB — PANEL 604721
Jug R 1 IgE: <0.1 kU/L
Jug R 3 IgE: 0.89 kU/L — AB

## 2022-06-10 LAB — PANEL 604239: ANA O 3 IgE: 0.11 kU/L — AB

## 2022-06-11 ENCOUNTER — Encounter: Payer: Self-pay | Admitting: Internal Medicine

## 2022-06-18 NOTE — Progress Notes (Unsigned)
FOLLOW UP Date of Service/Encounter:  06/20/22   Subjective:  Sonya Castro (DOB: Sep 02, 1990) is a 32 y.o. female who returns to the [facility] on 06/20/2022 in re-evaluation of the following: asthma, food allergies, allergic rhinitis, penicillin allergy History obtained from: chart review and patient.  For Review, LV was on 06/04/22  with Dr.Donelda Mailhot seen for  televisit for asthma flare.Additionally reported tongue swelling and itching after eating a snickers recently.  Had eaten a smoothie prior to this with mango and strawberries.   . We treated with prednisone, submitted for dupixent and requested work accommodations. Labs recommended for possible nut allergy.  Pertinent History/Diagnostics:  Asthma: Diagnosed as an adult, age 75 yo. Using rescue inhaler multiple times per day at work.  Triggers: fumes, scents, dog-has dog in the home. Roaches. exercise  AEC 600 on 04/21/21. She received prednisone at least 6 times in the past year.  Seen in ED 4 times for asthma/bronchitis in 2023. -  spirometry (05/16/22): ratio 0.67, 94% FEV1 (pre), 87% FEV1 (post)-mild obstruction without postbronchodilator response - labs: 05/16/22: IgE 2410, AEC 400 Allergic Rhinitis:  Ongoing nasal congestion, hyposmia. Right nostril is harder for her breath.  She can't taste and smell.  She has been having trouble with this for 3.5 months.  No ENT surgeries.  - SPT environmental panel (05/16/22):  skin testing positive to grass pollen, weed pollen, tree pollen, outdoor molds, cat, dog and horse Intradermal testing was positive to cockroach, mold mix 2 (indoor molds), dust Pollen food allergy syndrome: Pineapple, watermelon-makes her itchy.  She continues to eat these fruits despite these symptoms. Only happens when raw and fresh. Food Allergies Shellfish: she gets itchy all over, including her throat. She has avoided for over one year. Does not carry an epipen.  Peanuts: tongue swelling and mouth  itching immediately after eating a snickers bar - SPT negative to shellfish 05/16/22. - labs 05/16/22: positive to shrimp 4.62, crab 0.31, clam 0.26, scallop 0.30, oyster 0.24, lobster 0.16 - labs 06/05/22: peanut 8.89 (ara h2 0.13, ara h8 13.40, arah9 2.53), hazelnut 18, walnut 1.56, cashew 0.37, [place] nut 0.24, macadamia 1.69, pecan 0.42, pistachio 1.48, almond 3.94 Penicillin allergy:  Hives and sensation of throat closing. Last given in childhood.  Symptoms lasted around 2.5 days.  Today presents for follow-up. She was moved out of her work basement temporarily, and her symptoms have significantly improved.  Since moving out, she has not needed either her inhaler or her nebulizer.   At home, she got rid of her dog, and got a humidifier and air purifier.  She does feel a big difference with these changes.  Her neighbor who smokes moved out which is also helping. She was able to start back walking.  She was able to run up a hill, but did have some SOB. However, symptoms much milder than when we first met. In the past 4 weeks, the first 2 weeks of which she was using rescue around 4 times per week, and the past 2 weeks only 1-2 times. She feels significantly improved, but is concerned that she may have to go back to the basement at her work which has been a known trigger for her asthma. Her allergies have been bothering her now that tree pollen is getting more prominent. She is using her nasal sprays, singulair and zyrtec.  We discussed allergy shots once asthma controlled.  She is avoiding nuts and shellfish.   Allergies as of 06/20/2022  Reactions   Other    Positive testing   Peanut-containing Drug Products Itching   Positive testing   Amoxicillin Hives   Penicillins Hives, Itching, Swelling   Shellfish Allergy Itching        Medication List        Accurate as of June 20, 2022 12:45 PM. If you have any questions, ask your nurse or doctor.          Airsupra 90-80  MCG/ACT Aero Generic drug: Albuterol-Budesonide Inhale 2 puffs into the lungs as needed (maximum 12 puffs/day).   albuterol (2.5 MG/3ML) 0.083% nebulizer solution Commonly known as: PROVENTIL Take 3 mLs (2.5 mg total) by nebulization every 4 (four) hours as needed for wheezing or shortness of breath.   azelastine 0.1 % nasal spray Commonly known as: ASTELIN Place 2 sprays into both nostrils 2 (two) times daily.   Breztri Aerosphere 160-9-4.8 MCG/ACT Aero Generic drug: Budeson-Glycopyrrol-Formoterol Inhale 2 puffs into the lungs in the morning and at bedtime.   Compressor/Nebulizer Misc Inhale 1 kit into the lungs as directed.   EPINEPHrine 0.3 mg/0.3 mL Soaj injection Commonly known as: EpiPen 2-Pak Inject 0.3 mg into the muscle as needed for anaphylaxis.   fluticasone 50 MCG/ACT nasal spray Commonly known as: FLONASE Place 2 sprays into both nostrils daily.   fluticasone furoate-vilanterol 100-25 MCG/ACT Aepb Commonly known as: BREO ELLIPTA Inhale 1 puff into the lungs daily.   ipratropium-albuterol 0.5-2.5 (3) MG/3ML Soln Commonly known as: DUONEB Take 3 mLs by nebulization every 6 (six) hours as needed.   montelukast 10 MG tablet Commonly known as: SINGULAIR Take 1 tablet (10 mg total) by mouth at bedtime.       Past Medical History:  Diagnosis Date   Hypothyroidism    History reviewed. No pertinent surgical history. Otherwise, there have been no changes to her past medical history, surgical history, family history, or social history.  ROS: All others negative except as noted per HPI.   Objective:  BP 116/68 (BP Location: Right Arm, Patient Position: Sitting, Cuff Size: Large)   Pulse 92   Temp 98.1 F (36.7 C) (Temporal)   Resp 18   SpO2 100%  There is no height or weight on file to calculate BMI. Physical Exam: General Appearance:  Alert, cooperative, no distress, appears stated age  Head:  Normocephalic, without obvious abnormality, atraumatic   Eyes:  Conjunctiva clear, EOM's intact  Nose: Nares normal, hypertrophic turbinates, normal mucosa, and no visible anterior polyps  Throat: Lips, tongue normal; teeth and gums normal, normal posterior oropharynx  Neck: Supple, symmetrical  Lungs:   clear to auscultation bilaterally, Respirations unlabored, no coughing  Heart:  regular rate and rhythm and no murmur, Appears well perfused  Extremities: No edema  Skin: Skin color, texture, turgor normal, no rashes or lesions on visualized portions of skin  Neurologic: No gross deficits   Spirometry:  Tracings reviewed. Her effort: Good reproducible efforts. FVC: 3.36L FEV1: 2.65L, 110% predicted FEV1/FVC ratio: 0.79 Interpretation: Spirometry consistent with normal pattern.  Please see scanned spirometry results for details.  Assessment/Plan   Severe Persistent Asthma: chronic, improved. Will plan to start dupixent and if stable after a few months, consider AIT. - Controller Inhaler: Continue Breztri 160 mcg 2 puffs twice a day; This Should Be Used Everyday - Rinse mouth out after use - use with a spacer -Singulair (montelukast) continue 10 mg daily - stop if nightmares of mood changes. - Rescue Inhaler: AirSupra 2-4 puffs as needed (max 12  puffs/day) or  1 vial albuterol Use  every 4-6 hours as needed for chest tightness, wheezing, or coughing.  Can also use 15 minutes prior to exercise if you have symptoms with activity. Or 1 vial duonebs - every 6 to 8 hours as needed - Asthma is not controlled if:  - Symptoms are occurring >2 times a week OR  - >2 times a month nighttime awakenings  - You are requiring systemic steroids (prednisone/steroid injections) more than once per year  - Your require hospitalization for your asthma.  - Please call the clinic to schedule a follow up if these symptoms arise  -we will submit for dupixent injections for asthma-you should hear from [facility] in the next few days.  Seasonal and  Perennial Allergic Rhinitis-not controlled - 2024 allergy testing positive to grass pollen, weed pollen, tree pollen, outdoor molds, cat, dog and horse; cockroach, mold mix 2 (indoor molds), dust mites and ragweed mix - Prevention:  - allergen avoidance when possible - consider allergy shots as long term control of your symptoms by teaching your immune system to be more tolerant of your allergy triggers  - asthma must first be controlled - Symptom control: First use saline rinses twice daily then use flonase and astelin. - Continue Singulair (Montelukast) '10mg'$  nightly.   - Discontinue if nightmares of behavior changes. - Continue Antihistamine: daily or daily as needed.   -Options include Zyrtec (Cetirizine) '10mg'$ , Claritin (Loratadine) '10mg'$ , Allegra (Fexofenadine) '180mg'$ , or Xyzal (Levocetirinze) '5mg'$  - Can be purchased over-the-counter if not covered by insurance.  Allergic Conjunctivitis: chronic, stable - Consider  Pataday (Olopatadine) or Zaditor (ketotifen) for eye symptoms daily as needed  Food allergy: chronic, stable - please strictly avoid shellfish and peanuts and tree nuts - for SKIN only reaction, okay to take Benadryl 2 capsules every 4 hours - for SKIN + ANY additional symptoms, OR IF concern for LIFE THREATENING reaction = Epipen Autoinjector EpiPen 0.3 mg. - If using Epinephrine autoinjector, call 911 - A food allergy action plan has been provided and discussed. - Medic Alert identification is recommended.  Oral Allergy Syndrome (watermelon, pineapple):chronic, stable Avoid all raw fruits and vegetables that bother you. Allergy injections may improve these symptoms. - Heating these foods, buying them canned, and peeling these foods should allow them to be consumed without symptoms or with less symptoms.  H/O Penicillin allergy: Asthma must first be controlled. - please schedule follow-up appt at your convenience for penicillin testing followed by graded oral challenge if  indicated   Follow up : 3 months, sooner if needed. Thank you for allowing me to participate in your care.  Sigurd Sos, MD  [facility]

## 2022-06-20 ENCOUNTER — Encounter: Payer: Self-pay | Admitting: Internal Medicine

## 2022-06-20 ENCOUNTER — Ambulatory Visit (INDEPENDENT_AMBULATORY_CARE_PROVIDER_SITE_OTHER): Payer: BC Managed Care – PPO | Admitting: Internal Medicine

## 2022-06-20 VITALS — BP 116/68 | HR 92 | Temp 98.1°F | Resp 18

## 2022-06-20 DIAGNOSIS — T781XXD Other adverse food reactions, not elsewhere classified, subsequent encounter: Secondary | ICD-10-CM | POA: Diagnosis not present

## 2022-06-20 DIAGNOSIS — J3089 Other allergic rhinitis: Secondary | ICD-10-CM

## 2022-06-20 DIAGNOSIS — T7800XA Anaphylactic reaction due to unspecified food, initial encounter: Secondary | ICD-10-CM

## 2022-06-20 DIAGNOSIS — J4550 Severe persistent asthma, uncomplicated: Secondary | ICD-10-CM

## 2022-06-20 DIAGNOSIS — Z880 Allergy status to penicillin: Secondary | ICD-10-CM

## 2022-06-20 DIAGNOSIS — H1013 Acute atopic conjunctivitis, bilateral: Secondary | ICD-10-CM

## 2022-06-20 DIAGNOSIS — J302 Other seasonal allergic rhinitis: Secondary | ICD-10-CM

## 2022-06-20 MED ORDER — AIRSUPRA 90-80 MCG/ACT IN AERO: 2.0000 | INHALATION_SPRAY | RESPIRATORY_TRACT | 3 refills | Status: DC | PRN

## 2022-06-20 MED ORDER — MONTELUKAST SODIUM 10 MG PO TABS: 10.0000 mg | ORAL_TABLET | Freq: Every day | ORAL | 1 refills | Status: DC

## 2022-06-20 MED ORDER — BREZTRI AEROSPHERE 160-9-4.8 MCG/ACT IN AERO: 2.0000 | INHALATION_SPRAY | Freq: Two times a day (BID) | RESPIRATORY_TRACT | 3 refills | Status: DC

## 2022-06-20 NOTE — Patient Instructions (Addendum)
Severe Persistent Asthma: - Controller Inhaler: Continue Breztri 160 mcg 2 puffs twice a day; This Should Be Used Everyday - Rinse mouth out after use - use with a spacer -Singulair (montelukast) continue 10 mg daily - stop if nightmares of mood changes. - Rescue Inhaler:  AirSupra  2-4 puffs as needed (max 12 puffs/day) or  1 vial albuterol Use  every 4-6 hours as needed for chest tightness, wheezing, or coughing.  Can also use 15 minutes prior to exercise if you have symptoms with activity. Or 1 vial duonebs - every 6 to 8 hours as needed - Asthma is not controlled if:  - Symptoms are occurring >2 times a week OR  - >2 times a month nighttime awakenings  - You are requiring systemic steroids (prednisone/steroid injections) more than once per year  - Your require hospitalization for your asthma.  - Please call the clinic to schedule a follow up if these symptoms arise  -we will submit for dupixent injections for asthma-you should hear from [facility] in the next few days.  Seasonal and Perennial AllergicRhinitis - 2024 allergy testing positive to grass pollen, weed pollen, tree pollen, outdoor molds, cat, dog and horse; cockroach, mold mix 2 (indoor molds), dust mites and ragweed mix - Prevention:  - allergen avoidance when possible - consider allergy shots as long term control of your symptoms by teaching your immune system to be more tolerant of your allergy triggers  - asthma must first be controlled - Symptom control: First use saline rinses twice daily then use flonase and astelin. - Continue Singulair (Montelukast) '10mg'$  nightly.   - Discontinue if nightmares of behavior changes. - Continue Antihistamine: daily or daily as needed.   -Options include Zyrtec (Cetirizine) '10mg'$ , Claritin (Loratadine) '10mg'$ , Allegra (Fexofenadine) '180mg'$ , or Xyzal (Levocetirinze) '5mg'$  - Can be purchased over-the-counter if not covered by insurance.  Allergic Conjunctivitis:  - Consider  Pataday  (Olopatadine) or Zaditor (ketotifen) for eye symptoms daily as needed  Food allergy:  - please strictly avoid shellfish and peanuts and tree nuts - for SKIN only reaction, okay to take Benadryl 2 capsules every 4 hours - for SKIN + ANY additional symptoms, OR IF concern for LIFE THREATENING reaction = Epipen Autoinjector EpiPen 0.3 mg. - If using Epinephrine autoinjector, call 911 - A food allergy action plan has been provided and discussed. - Medic Alert identification is recommended.  Oral Allergy Syndrome (watermelon, pineapple): Avoid all raw fruits and vegetables that bother you. Allergy injections may improve these symptoms. - Heating these foods, buying them canned, and peeling these foods should allow them to be consumed without symptoms or with less symptoms.  H/O Penicillin allergy: Asthma must first be controlled. - please schedule follow-up appt at your convenience for penicillin testing followed by graded oral challenge if indicated   Follow up : 3 months, sooner if needed. Thank you for allowing me to participate in your care.  Sigurd Sos, MD [facility]  Reducing Pollen Exposure  The American Academy of Allergy, Asthma and Immunology suggests the following steps to reduce your exposure to pollen during allergy seasons.    Do not hang sheets or clothing out to dry; pollen may collect on these items. Do not mow lawns or spend time around freshly cut grass; mowing stirs up pollen. Keep windows closed at night.  Keep car windows closed while driving. Minimize morning activities outdoors, a time when pollen counts are usually at their highest. Stay indoors as much  as possible when pollen counts or humidity is high and on windy days when pollen tends to remain in the air longer. Use air conditioning when possible.  Many air conditioners have filters that trap the pollen spores. Use a HEPA room air filter to remove pollen form the indoor air you  breathe. Control of Dog or Cat Allergen  Avoidance is the best way to manage a dog or cat allergy. If you have a dog or cat and are allergic to dog or cats, consider removing the dog or cat from the home. If you have a dog or cat but don't want to find it a new home, or if your family wants a pet even though someone in the household is allergic, here are some strategies that may help keep symptoms at bay:  Keep the pet out of your bedroom and restrict it to only a few rooms. Be advised that keeping the dog or cat in only one room will not limit the allergens to that room. Don't pet, hug or kiss the dog or cat; if you do, wash your hands with soap and water. High-efficiency particulate air (HEPA) cleaners run continuously in a bedroom or living room can reduce allergen levels over time. Regular use of a high-efficiency vacuum cleaner or a central vacuum can reduce allergen levels. Giving your dog or cat a bath at least once a week can reduce airborne allergen. Control of Mold Allergen   Mold and fungi can grow on a variety of surfaces provided certain temperature and moisture conditions exist.  Outdoor molds grow on plants, decaying vegetation and soil.  The major outdoor mold, Alternaria and Cladosporium, are found in very high numbers during hot and dry conditions.  Generally, a late Summer - Fall peak is seen for common outdoor fungal spores.  Rain will temporarily lower outdoor mold spore count, but counts rise rapidly when the rainy period ends.  The most important indoor molds are Aspergillus and Penicillium.  Dark, humid and poorly ventilated basements are ideal sites for mold growth.  The next most common sites of mold growth are the bathroom and the kitchen.  Outdoor (Seasonal) Mold Control  Use air conditioning and keep windows closed Avoid exposure to decaying vegetation. Avoid leaf raking. Avoid grain handling. Consider wearing a face mask if working in moldy areas.    Indoor  (Perennial) Mold Control   Maintain humidity below 50%. Clean washable surfaces with 5% bleach solution. Remove sources e.g. contaminated carpets. DUST MITE AVOIDANCE MEASURES:  There are three main measures that need and can be taken to avoid house dust mites:  Reduce accumulation of dust in general -reduce furniture, clothing, carpeting, books, stuffed animals, especially in bedroom  Separate yourself from the dust -use pillow and mattress encasements (can be found at stores such as [employer] or online) -avoid direct exposure to air condition flow -use a HEPA filter device, especially in the bedroom; you can also use a HEPA filter vacuum cleaner -wipe dust with a moist towel instead of a dry towel or broom when cleaning  Decrease mites and/or their secretions -wash clothing and linen and stuffed animals at highest temperature possible, at least every 2 weeks -stuffed animals can also be placed in a bag and put in a freezer overnight  Despite the above measures, it is impossible to eliminate dust mites or their allergen completely from your home.  With the above measures the burden of mites in your home can be diminished, with  the goal of minimizing your allergic symptoms.  Success will be reached only when implementing and using all means together.

## 2022-06-21 ENCOUNTER — Telehealth: Payer: Self-pay | Admitting: *Deleted

## 2022-06-21 DIAGNOSIS — Z01419 Encounter for gynecological examination (general) (routine) without abnormal findings: Secondary | ICD-10-CM | POA: Diagnosis not present

## 2022-06-21 DIAGNOSIS — Z113 Encounter for screening for infections with a predominantly sexual mode of transmission: Secondary | ICD-10-CM | POA: Diagnosis not present

## 2022-06-21 DIAGNOSIS — Z114 Encounter for screening for human immunodeficiency virus [HIV]: Secondary | ICD-10-CM | POA: Diagnosis not present

## 2022-06-21 DIAGNOSIS — Z1159 Encounter for screening for other viral diseases: Secondary | ICD-10-CM | POA: Diagnosis not present

## 2022-06-21 NOTE — Telephone Encounter (Signed)
L/m for patient to contact me to advise approval, copay card and submit to [facility] for [place]

## 2022-06-21 NOTE — Telephone Encounter (Signed)
-----   Message from Clemon Chambers, MD sent at 06/04/2022  1:43 PM EST ----- Can we see about getting dupixent approved for her asthma? She has needed multiple steroid courses this year. Currently on Breztri. Thanks

## 2022-06-25 ENCOUNTER — Other Ambulatory Visit: Payer: Self-pay

## 2022-06-25 ENCOUNTER — Other Ambulatory Visit (HOSPITAL_COMMUNITY): Payer: Self-pay

## 2022-06-25 MED ORDER — DUPIXENT 300 MG/2ML ~~LOC~~ SOSY
600.0000 mg | PREFILLED_SYRINGE | Freq: Once | SUBCUTANEOUS | 11 refills | Status: AC
  Filled 2022-06-25: qty 4, 14d supply, fill #0

## 2022-06-25 NOTE — Addendum Note (Signed)
Addended by: Carin Hock on: 06/25/2022 11:30 AM   Modules accepted: Orders

## 2022-06-25 NOTE — Telephone Encounter (Signed)
Tried to reach patient to advise change from [place] to [facility] but v/m full

## 2022-06-25 NOTE — Telephone Encounter (Signed)
Spoke to patient and she wants to get her injs in clinic will submit to [facility] and sign her up for copay card. Will reach out once delivery set to make appt to come in to start therapy

## 2022-07-04 ENCOUNTER — Other Ambulatory Visit: Payer: Self-pay

## 2022-07-04 ENCOUNTER — Other Ambulatory Visit: Payer: Self-pay | Admitting: Internal Medicine

## 2022-07-04 ENCOUNTER — Emergency Department (HOSPITAL_COMMUNITY)
Admission: EM | Admit: 2022-07-04 | Discharge: 2022-07-04 | Disposition: A | Discharge status: Home / Self Care | Payer: BC Managed Care – PPO | Attending: Emergency Medicine | Admitting: Emergency Medicine

## 2022-07-04 DIAGNOSIS — R197 Diarrhea, unspecified: Secondary | ICD-10-CM | POA: Insufficient documentation

## 2022-07-04 DIAGNOSIS — J45909 Unspecified asthma, uncomplicated: Secondary | ICD-10-CM | POA: Insufficient documentation

## 2022-07-04 DIAGNOSIS — R112 Nausea with vomiting, unspecified: Secondary | ICD-10-CM | POA: Insufficient documentation

## 2022-07-04 DIAGNOSIS — Z91010 Allergy to peanuts: Secondary | ICD-10-CM | POA: Diagnosis not present

## 2022-07-04 DIAGNOSIS — Z7951 Long term (current) use of inhaled steroids: Secondary | ICD-10-CM | POA: Diagnosis not present

## 2022-07-04 LAB — CBC WITH DIFFERENTIAL/PLATELET
Abs Immature Granulocytes: 0.02 10*3/uL (ref 0.00–0.07)
Basophils Absolute: 0 10*3/uL (ref 0.0–0.1)
Basophils Relative: 0 %
Eosinophils Absolute: 0 10*3/uL (ref 0.0–0.5)
Eosinophils Relative: 1 %
HCT: 38.6 % (ref 36.0–46.0)
Hemoglobin: 12.4 g/dL (ref 12.0–15.0)
Immature Granulocytes: 0 %
Lymphocytes Relative: 13 %
Lymphs Abs: 0.9 10*3/uL (ref 0.7–4.0)
MCH: 27.3 pg (ref 26.0–34.0)
MCHC: 32.1 g/dL (ref 30.0–36.0)
MCV: 85 fL (ref 80.0–100.0)
Monocytes Absolute: 0.3 10*3/uL (ref 0.1–1.0)
Monocytes Relative: 4 %
Neutro Abs: 5.8 10*3/uL (ref 1.7–7.7)
Neutrophils Relative %: 82 %
Platelets: 305 10*3/uL (ref 150–400)
RBC: 4.54 MIL/uL (ref 3.87–5.11)
RDW: 13.4 % (ref 11.5–15.5)
WBC: 7.1 10*3/uL (ref 4.0–10.5)
nRBC: 0 % (ref 0.0–0.2)

## 2022-07-04 LAB — COMPREHENSIVE METABOLIC PANEL
ALT: 15 U/L (ref 0–44)
AST: 17 U/L (ref 15–41)
Albumin: 4.3 g/dL (ref 3.5–5.0)
Alkaline Phosphatase: 60 U/L (ref 38–126)
Anion gap: 10 (ref 5–15)
BUN: 10 mg/dL (ref 6–20)
CO2: 21 mmol/L — ABNORMAL LOW (ref 22–32)
Calcium: 8.7 mg/dL — ABNORMAL LOW (ref 8.9–10.3)
Chloride: 105 mmol/L (ref 98–111)
Creatinine, Ser: 0.59 mg/dL (ref 0.44–1.00)
GFR, Estimated: >60 mL/min (ref >60)
Glucose, Bld: 100 mg/dL — ABNORMAL HIGH (ref 70–99)
Potassium: 3.8 mmol/L (ref 3.5–5.1)
Sodium: 136 mmol/L (ref 135–145)
Total Bilirubin: 1 mg/dL (ref 0.3–1.2)
Total Protein: 7.5 g/dL (ref 6.5–8.1)

## 2022-07-04 LAB — I-STAT BETA HCG BLOOD, ED (MC, WL, AP ONLY): I-stat hCG, quantitative: <5 m[IU]/mL (ref <5)

## 2022-07-04 LAB — LIPASE, BLOOD: Lipase: 25 U/L (ref 11–51)

## 2022-07-04 MED ORDER — SODIUM CHLORIDE 0.9 % IV BOLUS
1000.0000 mL | Freq: Once | INTRAVENOUS | Status: AC
  Administered 2022-07-04: 1000 mL via INTRAVENOUS

## 2022-07-04 MED ORDER — ONDANSETRON HCL 4 MG PO TABS: 4.0000 mg | ORAL_TABLET | Freq: Three times a day (TID) | ORAL | 0 refills | Status: DC | PRN

## 2022-07-04 MED ORDER — ONDANSETRON HCL 4 MG/2ML IJ SOLN
4.0000 mg | Freq: Once | INTRAMUSCULAR | Status: AC
  Administered 2022-07-04: 4 mg via INTRAVENOUS
  Filled 2022-07-04: qty 2

## 2022-07-04 MED ORDER — MORPHINE SULFATE (PF) 4 MG/ML IV SOLN
4.0000 mg | Freq: Once | INTRAVENOUS | Status: AC
  Administered 2022-07-04: 4 mg via INTRAVENOUS
  Filled 2022-07-04: qty 1

## 2022-07-04 NOTE — ED Triage Notes (Signed)
C/o n/v/d x12 hours with headache, generalized abd pain, and chills.

## 2022-07-04 NOTE — ED Provider Notes (Signed)
[facility] Provider Note   CSN: [id number] Arrival date & time: 07/04/22  1017     History  No chief complaint on file.   Sonya Castro is a 32 y.o. female.  The history is provided by the patient and medical records. No language interpreter was used.     32 year old female significant history of asthma, anxiety, anemia, thyroid disease, presenting with complaints of nausea vomit diarrhea.  Patient reports she ate soup from a [employer] around lunchtime yesterday and since then she has had persistent nausea vomiting and diarrhea.  She endorsed body aches, chills, no appetite and feeling fatigued and dehydrated.  No one else is sick.  No fever or urinary symptoms.  Unable to keep anything down. Endorse throbbing headache  Home Medications Prior to Admission medications   Medication Sig Start Date End Date Taking? Authorizing Provider  albuterol (PROVENTIL) (2.5 MG/3ML) 0.083% nebulizer solution Take 3 mLs (2.5 mg total) by nebulization every 4 (four) hours as needed for wheezing or shortness of breath. 12/31/21 12/31/22  Duffy Bruce, MD  Albuterol-Budesonide (AIRSUPRA) 90-80 MCG/ACT AERO Inhale 2 puffs into the lungs as needed (maximum 12 puffs/day). 06/20/22   Clemon Chambers, MD  azelastine (ASTELIN) 0.1 % nasal spray Place 2 sprays into both nostrils 2 (two) times daily. 02/15/22   [provider]  Budeson-Glycopyrrol-Formoterol (BREZTRI AEROSPHERE) 160-9-4.8 MCG/ACT AERO Inhale 2 puffs into the lungs in the morning and at bedtime. 06/20/22   Clemon Chambers, MD  EPINEPHrine (EPIPEN 2-PAK) 0.3 mg/0.3 mL IJ SOAJ injection Inject 0.3 mg into the muscle as needed for anaphylaxis. 05/16/22   Clemon Chambers, MD  fluticasone [facility]) 50 MCG/ACT nasal spray Place 2 sprays into both nostrils daily. 06/28/21   Jon Billings, NP  ipratropium-albuterol (DUONEB) 0.5-2.5 (3) MG/3ML SOLN Take 3 mLs by nebulization every 6 (six) hours as  needed. 06/04/22   Clemon Chambers, MD  montelukast (SINGULAIR) 10 MG tablet Take 1 tablet (10 mg total) by mouth at bedtime. 06/20/22   Clemon Chambers, MD  Nebulizers (COMPRESSOR/NEBULIZER) MISC Inhale 1 kit into the lungs as directed. 02/15/22   [provider]      Allergies    Other, Peanut-containing drug products, Shellfish allergy, Amoxicillin, and Penicillins    Review of Systems   Review of Systems  All other systems reviewed and are negative.   Physical Exam Updated Vital Signs BP 120/78 (BP Location: Right Arm)   Pulse (!) 101   Temp 98.2 F (36.8 C) (Oral)   Resp 18   Ht 5' (1.524 m)   Wt 76.2 kg   SpO2 100%   BMI 32.81 kg/m  Physical Exam Vitals and nursing note reviewed.  Constitutional:      General: She is not in acute distress.    Appearance: She is well-developed.  HENT:     Head: Atraumatic.  Eyes:     Conjunctiva/sclera: Conjunctivae normal.  Neck:     Comments: No nuchal rigidity Cardiovascular:     Rate and Rhythm: Normal rate and regular rhythm.     Pulses: Normal pulses.     Heart sounds: Normal heart sounds.  Pulmonary:     Effort: Pulmonary effort is normal.  Abdominal:     Palpations: Abdomen is soft.     Tenderness: There is abdominal tenderness (Mild diffuse tenderness without focal point tenderness.).  Musculoskeletal:     Cervical back: Normal range of motion and neck supple.  Skin:    Findings: No rash.  Neurological:     Mental Status: She is alert.  Psychiatric:        Mood and Affect: Mood normal.     ED Results / Procedures / Treatments   Labs (all labs ordered are listed, but only abnormal results are displayed) Labs Reviewed  COMPREHENSIVE METABOLIC PANEL - Abnormal; Notable for the following components:      Result Value   CO2 21 (*)    Glucose, Bld 100 (*)    Calcium 8.7 (*)    All other components within normal limits  CBC WITH DIFFERENTIAL/PLATELET  LIPASE, BLOOD  URINALYSIS, ROUTINE W REFLEX  MICROSCOPIC  I-STAT BETA HCG BLOOD, ED ([facility], [facility], [facility] ONLY)  I-STAT BETA HCG BLOOD, ED ([facility], [facility], [facility] ONLY)    EKG None  Radiology No results found.  Procedures Procedures    Medications Ordered in ED Medications  sodium chloride 0.9 % bolus 1,000 mL (1,000 mLs Intravenous New Bag/Given 07/04/22 1153)  ondansetron (ZOFRAN) injection 4 mg (4 mg Intravenous Given 07/04/22 1150)  morphine (PF) 4 MG/ML injection 4 mg (4 mg Intravenous Given 07/04/22 1150)    ED Course/ Medical Decision Making/ A&P                             Medical Decision Making Amount and/or Complexity of Data Reviewed Labs: ordered.  Risk Prescription drug management.   BP 120/78 (BP Location: Right Arm)   Pulse (!) 101   Temp 98.2 F (36.8 C) (Oral)   Resp 18   Ht 5' (1.524 m)   Wt 76.2 kg   SpO2 100%   BMI 32.81 kg/m   85:36 AM  31 year old female significant history of asthma, anxiety, anemia, thyroid disease, presenting with complaints of nausea vomit diarrhea.  Patient reports she ate soup from a [employer] around lunchtime yesterday and since then she has had persistent nausea vomiting and diarrhea.  She endorsed body aches, chills, no appetite and feeling fatigued and dehydrated.  No one else is sick.  No fever or urinary symptoms.  Unable to keep anything down.  On exam, patient is laying in the bed, she has normal heart rate and rhythm, lungs clear to auscultation abdomen is soft but diffusely tender without focal point tenderness.  Vital signs notable for mild tachycardia with heart rate of 101.  She is afebrile no hypoxia and normal blood pressure.  Symptoms suggestive of viral GI source, perhaps early appendicitis.  Workup initiated.  Will give antiemetic, and IV fluid.  -Labs ordered, independently viewed and interpreted by me.  Labs remarkable for reassuring electrolytes panel -The patient was maintained on a cardiac monitor.  I personally viewed and interpreted the cardiac  monitored which showed an underlying rhythm of: sinus tachycardia -Imaging including abd/pelvis CT considered but not perform as pt is without reproducible abdominal pain -This patient presents to the ED for concern of n/v/d, this involves an extensive number of treatment options, and is a complaint that carries with it a high risk of complications and morbidity.  The differential diagnosis includes viral GI, appendicitis, colitis, diverticulitis, pancreatitis -Co morbidities that complicate the patient evaluation includes none -Treatment includes IVF, zofran, morphine -Reevaluation of the patient after these medicines showed that the patient improved -PCP office notes or outside notes reviewed -Escalation to admission/observation considered: patients feels much better, is comfortable with discharge, and will follow up with PCP -Prescription medication  considered, patient comfortable with zofran -Social Determinant of Health considered          Final Clinical Impression(s) / ED Diagnoses Final diagnoses:  Nausea vomiting and diarrhea    Rx / DC Orders ED Discharge Orders          Ordered    ondansetron (ZOFRAN) 4 MG tablet  Every 8 hours PRN        07/04/22 1338              Domenic Moras, PA-C 07/04/22 1339    Cur[place], [place], DO 07/04/22 1418

## 2022-07-04 NOTE — Discharge Instructions (Signed)
You have been evaluated for your symptoms.  You are likely experiencing a viral stomach flu.  You may take Zofran as needed for nausea, stay hydrated, return to the ER if your symptoms worsen or if you have other concern.

## 2022-07-16 NOTE — Telephone Encounter (Signed)
Patient returned call to speak with you, I advised you would be back in office next Monday. Patient verbalized she will call back then to speak with you.

## 2022-08-03 ENCOUNTER — Other Ambulatory Visit: Payer: Self-pay | Admitting: *Deleted

## 2022-08-03 MED ORDER — DUPIXENT 300 MG/2ML ~~LOC~~ SOSY: 300.0000 mg | PREFILLED_SYRINGE | SUBCUTANEOUS | 11 refills | Status: AC

## 2022-09-13 DIAGNOSIS — J352 Hypertrophy of adenoids: Secondary | ICD-10-CM | POA: Diagnosis not present

## 2022-09-13 DIAGNOSIS — R439 Unspecified disturbances of smell and taste: Secondary | ICD-10-CM | POA: Diagnosis not present

## 2022-09-21 DIAGNOSIS — R439 Unspecified disturbances of smell and taste: Secondary | ICD-10-CM | POA: Diagnosis not present

## 2022-10-23 ENCOUNTER — Emergency Department (HOSPITAL_BASED_OUTPATIENT_CLINIC_OR_DEPARTMENT_OTHER)
Admission: EM | Admit: 2022-10-23 | Discharge: 2022-10-23 | Disposition: A | Discharge status: Home / Self Care | Payer: Medicaid Other | Attending: Emergency Medicine | Admitting: Emergency Medicine

## 2022-10-23 ENCOUNTER — Encounter (HOSPITAL_BASED_OUTPATIENT_CLINIC_OR_DEPARTMENT_OTHER): Payer: Self-pay | Admitting: Pediatrics

## 2022-10-23 ENCOUNTER — Other Ambulatory Visit: Payer: Self-pay

## 2022-10-23 ENCOUNTER — Emergency Department (HOSPITAL_BASED_OUTPATIENT_CLINIC_OR_DEPARTMENT_OTHER): Payer: Medicaid Other

## 2022-10-23 DIAGNOSIS — Z91010 Allergy to peanuts: Secondary | ICD-10-CM | POA: Diagnosis not present

## 2022-10-23 DIAGNOSIS — Z7951 Long term (current) use of inhaled steroids: Secondary | ICD-10-CM | POA: Diagnosis not present

## 2022-10-23 DIAGNOSIS — J45909 Unspecified asthma, uncomplicated: Secondary | ICD-10-CM | POA: Diagnosis not present

## 2022-10-23 DIAGNOSIS — R1084 Generalized abdominal pain: Secondary | ICD-10-CM | POA: Diagnosis present

## 2022-10-23 DIAGNOSIS — R112 Nausea with vomiting, unspecified: Secondary | ICD-10-CM | POA: Diagnosis not present

## 2022-10-23 LAB — PREGNANCY, URINE: Preg Test, Ur: NEGATIVE

## 2022-10-23 LAB — COMPREHENSIVE METABOLIC PANEL
ALT: 29 U/L (ref 0–44)
AST: 18 U/L (ref 15–41)
Albumin: 4.4 g/dL (ref 3.5–5.0)
Alkaline Phosphatase: 61 U/L (ref 38–126)
Anion gap: 8 (ref 5–15)
BUN: 9 mg/dL (ref 6–20)
CO2: 24 mmol/L (ref 22–32)
Calcium: 9.2 mg/dL (ref 8.9–10.3)
Chloride: 107 mmol/L (ref 98–111)
Creatinine, Ser: 0.72 mg/dL (ref 0.44–1.00)
GFR, Estimated: >60 mL/min (ref >60)
Glucose, Bld: 97 mg/dL (ref 70–99)
Potassium: 4.2 mmol/L (ref 3.5–5.1)
Sodium: 139 mmol/L (ref 135–145)
Total Bilirubin: 0.4 mg/dL (ref 0.3–1.2)
Total Protein: 7.2 g/dL (ref 6.5–8.1)

## 2022-10-23 LAB — CBC
HCT: 35.7 % — ABNORMAL LOW (ref 36.0–46.0)
Hemoglobin: 11.4 g/dL — ABNORMAL LOW (ref 12.0–15.0)
MCH: 27 pg (ref 26.0–34.0)
MCHC: 31.9 g/dL (ref 30.0–36.0)
MCV: 84.6 fL (ref 80.0–100.0)
Platelets: 364 10*3/uL (ref 150–400)
RBC: 4.22 MIL/uL (ref 3.87–5.11)
RDW: 14 % (ref 11.5–15.5)
WBC: 7.5 10*3/uL (ref 4.0–10.5)
nRBC: 0 % (ref 0.0–0.2)

## 2022-10-23 LAB — URINALYSIS, ROUTINE W REFLEX MICROSCOPIC
Bilirubin Urine: NEGATIVE
Glucose, UA: NEGATIVE mg/dL
Ketones, ur: NEGATIVE mg/dL
Leukocytes,Ua: NEGATIVE
Nitrite: NEGATIVE
Protein, ur: NEGATIVE mg/dL
Specific Gravity, Urine: 1.025 (ref 1.005–1.030)
pH: 6.5 (ref 5.0–8.0)

## 2022-10-23 LAB — LIPASE, BLOOD: Lipase: 12 U/L (ref 11–51)

## 2022-10-23 LAB — URINALYSIS, MICROSCOPIC (REFLEX)

## 2022-10-23 MED ORDER — SODIUM CHLORIDE 0.9 % IV BOLUS
1000.0000 mL | Freq: Once | INTRAVENOUS | Status: AC
  Administered 2022-10-23: 1000 mL via INTRAVENOUS

## 2022-10-23 MED ORDER — ALUM & MAG HYDROXIDE-SIMETH 200-200-20 MG/5ML PO SUSP
30.0000 mL | Freq: Once | ORAL | Status: AC
  Administered 2022-10-23: 30 mL via ORAL
  Filled 2022-10-23: qty 30

## 2022-10-23 MED ORDER — ONDANSETRON HCL 4 MG/2ML IJ SOLN
4.0000 mg | Freq: Once | INTRAMUSCULAR | Status: AC
  Administered 2022-10-23: 4 mg via INTRAVENOUS
  Filled 2022-10-23: qty 2

## 2022-10-23 MED ORDER — LIDOCAINE VISCOUS HCL 2 % MT SOLN
15.0000 mL | Freq: Once | OROMUCOSAL | Status: AC
  Administered 2022-10-23: 15 mL via ORAL
  Filled 2022-10-23: qty 15

## 2022-10-23 MED ORDER — DICYCLOMINE HCL 10 MG PO CAPS
10.0000 mg | ORAL_CAPSULE | Freq: Once | ORAL | Status: AC
  Administered 2022-10-23: 10 mg via ORAL
  Filled 2022-10-23: qty 1

## 2022-10-23 MED ORDER — IOHEXOL 300 MG/ML  SOLN
100.0000 mL | Freq: Once | INTRAMUSCULAR | Status: AC | PRN
  Administered 2022-10-23: 100 mL via INTRAVENOUS

## 2022-10-23 NOTE — ED Triage Notes (Signed)
C/O ongoing abdominal pain on all over, stabbing in nature with some NV initially. Stated was seen when it started on Friday but pain is progressive and unable to eat and able to tolerate liquids. LBM yesterday. Stated hx of asthma and started on Dupixent a month ago.

## 2022-10-23 NOTE — Discharge Instructions (Signed)
Continue using the nausea medication and Bentyl that you are prescribed as needed, I recommend sticking to a bland diet until your symptoms have improved.  I have given you the name of a GI doctor to follow-up with any continued to have abdominal pain that is not resolving despite treatment.  Please return for worsening abdominal pain fever, blood in your vomit or stool.

## 2022-10-23 NOTE — ED Notes (Signed)
Discharge paperwork reviewed entirely with patient, including follow up care. Pain was under control. The patient received instruction and coaching on their prescriptions, and all follow-up questions were answered.  Pt verbalized understanding as well as all parties involved. No questions or concerns voiced at the time of discharge. No acute distress noted.   Pt ambulated out to PVA without incident or assistance.  

## 2022-10-23 NOTE — ED Provider Notes (Signed)
EMERGENCY DEPARTMENT AT [facility] Provider Note   CSN: [id number] Arrival date & time: 10/23/22  0932     History  Chief Complaint  Patient presents with   Abdominal Pain    Sonya Castro is a 32 y.o. female past medical history significant for obesity, anxiety, anemia, asthma who presents with concern for ongoing diffuse abdominal pain.  She endorses that it is stabbing in nature with some nausea and vomiting initially.  She was seen at the emergency department on Friday, had dicyclomine, Zofran with some resolution at that time but pain is ongoing at this time.  She endorses 9/10 pain.  She reports normal bowel movements, no diarrhea, constipation.  No previous intra-abdominal surgery.  She denies any fever, chills.   Abdominal Pain      Home Medications Prior to Admission medications   Medication Sig Start Date End Date Taking? Authorizing Provider  albuterol (PROVENTIL) (2.5 MG/3ML) 0.083% nebulizer solution Take 3 mLs (2.5 mg total) by nebulization every 4 (four) hours as needed for wheezing or shortness of breath. 12/31/21 12/31/22  Shaune Pollack, MD  Albuterol-Budesonide (AIRSUPRA) 90-80 MCG/ACT AERO INHALE 2 PUFFS INTO THE LUNGS AS NEEDED (MAXIMUM 12 PUFFS/DAY). 07/04/22   Verlee Monte, MD  azelastine (ASTELIN) 0.1 % nasal spray Place 2 sprays into both nostrils 2 (two) times daily. 02/15/22   [provider]  Budeson-Glycopyrrol-Formoterol (BREZTRI AEROSPHERE) 160-9-4.8 MCG/ACT AERO Inhale 2 puffs into the lungs in the morning and at bedtime. 06/20/22   Verlee Monte, MD  dupilumab (DUPIXENT) 300 MG/2ML prefilled syringe Inject 300 mg into the skin every 14 (fourteen) days. 08/03/22   Verlee Monte, MD  EPINEPHrine (EPIPEN 2-PAK) 0.3 mg/0.3 mL IJ SOAJ injection Inject 0.3 mg into the muscle as needed for anaphylaxis. 05/16/22   Verlee Monte, MD  fluticasone [facility]) 50 MCG/ACT nasal spray Place 2 sprays into both nostrils daily. 06/28/21    Larae Grooms, NP  ipratropium-albuterol (DUONEB) 0.5-2.5 (3) MG/3ML SOLN Take 3 mLs by nebulization every 6 (six) hours as needed. 06/04/22   Verlee Monte, MD  montelukast (SINGULAIR) 10 MG tablet Take 1 tablet (10 mg total) by mouth at bedtime. 06/20/22   Verlee Monte, MD  Nebulizers (COMPRESSOR/NEBULIZER) MISC Inhale 1 kit into the lungs as directed. 02/15/22   [provider]  ondansetron (ZOFRAN) 4 MG tablet Take 1 tablet (4 mg total) by mouth every 8 (eight) hours as needed for nausea or vomiting. 07/04/22   Fayrene Helper, PA-C      Allergies    Other, Peanut-containing drug products, Shellfish allergy, Amoxicillin, and Penicillins    Review of Systems   Review of Systems  Gastrointestinal:  Positive for abdominal pain.  All other systems reviewed and are negative.   Physical Exam Updated Vital Signs BP 112/74   Pulse (!) 52   Temp 98 F (36.7 C) (Oral)   Resp 16   Ht 5' (1.524 m)   Wt 79.4 kg   LMP 10/19/2022   SpO2 100%   BMI 34.18 kg/m  Physical Exam Vitals and nursing note reviewed.  Constitutional:      General: She is not in acute distress.    Appearance: Normal appearance.  HENT:     Head: Normocephalic and atraumatic.  Eyes:     General:        Right eye: No discharge.        Left eye: No discharge.  Cardiovascular:     Rate  and Rhythm: Normal rate and regular rhythm.     Heart sounds: No murmur heard.    No friction rub. No gallop.  Pulmonary:     Effort: Pulmonary effort is normal.     Breath sounds: Normal breath sounds.  Abdominal:     General: Bowel sounds are normal.     Palpations: Abdomen is soft.     Comments: Diffusely tender throughout, no rebound, rigidity, guarding  Skin:    General: Skin is warm and dry.     Capillary Refill: Capillary refill takes less than 2 seconds.  Neurological:     Mental Status: She is alert and oriented to person, place, and time.  Psychiatric:        Mood and Affect: Mood normal.         Behavior: Behavior normal.     ED Results / Procedures / Treatments   Labs (all labs ordered are listed, but only abnormal results are displayed) Labs Reviewed  CBC - Abnormal; Notable for the following components:      Result Value   Hemoglobin 11.4 (*)    HCT 35.7 (*)    All other components within normal limits  URINALYSIS, ROUTINE W REFLEX MICROSCOPIC - Abnormal; Notable for the following components:   Hgb urine dipstick MODERATE (*)    All other components within normal limits  URINALYSIS, MICROSCOPIC (REFLEX) - Abnormal; Notable for the following components:   Bacteria, UA RARE (*)    All other components within normal limits  LIPASE, BLOOD  COMPREHENSIVE METABOLIC PANEL  PREGNANCY, URINE    EKG None  Radiology CT ABDOMEN PELVIS W CONTRAST  Result Date: 10/23/2022 CLINICAL DATA:  Abdominal pain, acute, nonlocalized. EXAM: CT ABDOMEN AND PELVIS WITH CONTRAST TECHNIQUE: Multidetector CT imaging of the abdomen and pelvis was performed using the standard protocol following bolus administration of intravenous contrast. RADIATION DOSE REDUCTION: This exam was performed according to the departmental dose-optimization program which includes automated exposure control, adjustment of the mA and/or kV according to patient size and/or use of iterative reconstruction technique. CONTRAST:  OMNIPAQUE IOHEXOL 300 MG/ML  SOLN COMPARISON:  None Available. FINDINGS: Lower chest: No acute abnormality. Hepatobiliary: No focal liver abnormality is seen. No gallstones, gallbladder wall thickening, or biliary dilatation. Pancreas: Unremarkable. No pancreatic ductal dilatation or surrounding inflammatory changes. Spleen: Normal. Adrenals/Urinary Tract: Adrenal glands are unremarkable. Kidneys are normal, without renal calculi, focal lesion, or hydronephrosis. Bladder is unremarkable. Stomach/Bowel: Normal stomach and duodenum. No dilated loops of small bowel. Normal appendix is visualized on coronal  image 58 series 5. Colon is unremarkable. No bowel wall thickening or surrounding inflammation. Vascular/Lymphatic: No significant vascular findings are present. No enlarged abdominal or pelvic lymph nodes. Reproductive: Uterus and bilateral adnexa are unremarkable. Other: No abdominal wall hernia or abnormality. No abdominopelvic ascites. Musculoskeletal: No acute or significant osseous findings. IMPRESSION: No acute findings in the abdomen or pelvis. Electronically Signed   By: Orvan Falconer M.D.   On: 10/23/2022 13:06    Procedures Procedures    Medications Ordered in ED Medications  sodium chloride 0.9 % bolus 1,000 mL (1,000 mLs Intravenous New Bag/Given 10/23/22 1235)  dicyclomine (BENTYL) capsule 10 mg (10 mg Oral Given 10/23/22 1245)  alum & mag hydroxide-simeth (MAALOX/MYLANTA) 200-200-20 MG/5ML suspension 30 mL (30 mLs Oral Given 10/23/22 1246)    And  lidocaine (XYLOCAINE) 2 % viscous mouth solution 15 mL (15 mLs Oral Given 10/23/22 1245)  ondansetron (ZOFRAN) injection 4 mg (4 mg Intravenous Given 10/23/22  1235)  iohexol (OMNIPAQUE) 300 MG/ML solution 100 mL (100 mLs Intravenous Contrast Given 10/23/22 1153)    ED Course/ Medical Decision Making/ A&P                             Medical Decision Making Amount and/or Complexity of Data Reviewed Labs: ordered. Radiology: ordered.  Risk OTC drugs. Prescription drug management.   This patient is a 32 y.o. female  who presents to the ED for concern of abdominal pain, nausea.   Differential diagnoses prior to evaluation: The emergent differential diagnosis includes, but is not limited to,  The causes of generalized abdominal pain include but are not limited to AAA, mesenteric ischemia, appendicitis, diverticulitis, DKA, gastritis, gastroenteritis, AMI, nephrolithiasis, pancreatitis, peritonitis, adrenal insufficiency,lead poisoning, iron toxicity, intestinal ischemia, constipation, UTI,SBO/LBO, splenic rupture, biliary disease,  IBD, IBS, PUD, or hepatitis . This is not an exhaustive differential.   Past Medical History / Co-morbidities / Social History: Anxiety, anemia, asthma  Additional history: Chart reviewed. Pertinent results include: Reviewed lab work, and medications given during her emergency department visit just a few days ago for similar complaint.  Physical Exam: Physical exam performed. The pertinent findings include: Diffusely tender throughout, no rebound, rigidity, guarding   Lab Tests/Imaging studies: I personally interpreted labs/imaging and the pertinent results include:  CBC unremarkable, mild anemia, HGB 11.4, UA unremarkable other than mild hgb, patient reports recents menses, negative urine pregnancy test, CMP, unremarkable, lipase normal. CTAP with no acute abnormality to explain patient's symptoms. I agree with the radiologist interpretation.  Medications: I ordered medication including GI cocktail, bolus, bentyl, zofran.  I have reviewed the patients home medicines and have made adjustments as needed.   Disposition: After consideration of the diagnostic results and the patients response to treatment, I feel that patient with non specific abdominal pain, possible gastroenteritis vs gerd, vs other .   emergency department workup does not suggest an emergent condition requiring admission or immediate intervention beyond what has been performed at this time. The plan is: as above, encouraged GI follow up. The patient is safe for discharge and has been instructed to return immediately for worsening symptoms, change in symptoms or any other concerns.  Final Clinical Impression(s) / ED Diagnoses Final diagnoses:  Generalized abdominal pain  Nausea and vomiting, unspecified vomiting type    Rx / DC Orders ED Discharge Orders     None         [place] 10/23/22 1331    Ernie Avena, MD 10/23/22 1336

## 2022-10-23 NOTE — ED Notes (Signed)
Pt. Reports abd. Pain all over abd. Pain   no eating a full meal since Sunday.

## 2022-12-27 ENCOUNTER — Ambulatory Visit
Admission: EM | Admit: 2022-12-27 | Discharge: 2022-12-27 | Disposition: A | Discharge status: Home / Self Care | Payer: Medicaid Other | Attending: Internal Medicine | Admitting: Internal Medicine

## 2022-12-27 DIAGNOSIS — B9689 Other specified bacterial agents as the cause of diseases classified elsewhere: Secondary | ICD-10-CM | POA: Diagnosis not present

## 2022-12-27 DIAGNOSIS — N760 Acute vaginitis: Secondary | ICD-10-CM | POA: Diagnosis not present

## 2022-12-27 DIAGNOSIS — R300 Dysuria: Secondary | ICD-10-CM | POA: Diagnosis present

## 2022-12-27 LAB — POCT URINE PREGNANCY: Preg Test, Ur: NEGATIVE

## 2022-12-27 LAB — POCT URINALYSIS DIP (MANUAL ENTRY)
Bilirubin, UA: NEGATIVE
Blood, UA: NEGATIVE
Glucose, UA: NEGATIVE mg/dL
Leukocytes, UA: NEGATIVE
Nitrite, UA: NEGATIVE
Protein Ur, POC: NEGATIVE mg/dL
Spec Grav, UA: >=1.03 — AB (ref 1.010–1.025)
Urobilinogen, UA: 0.2 E.U./dL
pH, UA: 6 (ref 5.0–8.0)

## 2022-12-27 MED ORDER — METRONIDAZOLE 500 MG PO TABS: 500.0000 mg | ORAL_TABLET | Freq: Two times a day (BID) | ORAL | 0 refills | Status: DC

## 2022-12-27 MED ORDER — FLUCONAZOLE 150 MG PO TABS: 150.0000 mg | ORAL_TABLET | ORAL | 0 refills | Status: DC

## 2022-12-27 NOTE — ED Provider Notes (Signed)
[facility]  Note:  This document was prepared using Conservation officer, historic buildings and may include unintentional dictation errors.  MRN: [id number] DOB: 09/06/1990  Subjective:   Sonya Castro is a 32 y.o. female presenting for 3-day history of acute onset recurrent thick vaginal discharge and malodor.  Patient has also had dysuria.  Has longstanding history of UTIs, BV infections and to a lesser degree yeast infection.  Would like to be covered for BV and yeast.  Admits that she does not drink as much water as she should.  She does do a lot of herbal teas.  Would like STI check too.  No current facility-administered medications for this encounter.  Current Outpatient Medications:    albuterol (PROVENTIL) (2.5 MG/3ML) 0.083% nebulizer solution, Take 3 mLs (2.5 mg total) by nebulization every 4 (four) hours as needed for wheezing or shortness of breath., Disp: 75 mL, Rfl: 0   Albuterol-Budesonide (AIRSUPRA) 90-80 MCG/ACT AERO, INHALE 2 PUFFS INTO THE LUNGS AS NEEDED (MAXIMUM 12 PUFFS/DAY)., Disp: 10.7 g, Rfl: 3   azelastine (ASTELIN) 0.1 % nasal spray, Place 2 sprays into both nostrils 2 (two) times daily., Disp: , Rfl:    Budeson-Glycopyrrol-Formoterol (BREZTRI AEROSPHERE) 160-9-4.8 MCG/ACT AERO, Inhale 2 puffs into the lungs in the morning and at bedtime., Disp: 1 each, Rfl: 3   dupilumab (DUPIXENT) 300 MG/2ML prefilled syringe, Inject 300 mg into the skin every 14 (fourteen) days., Disp: 4 mL, Rfl: 11   EPINEPHrine (EPIPEN 2-PAK) 0.3 mg/0.3 mL IJ SOAJ injection, Inject 0.3 mg into the muscle as needed for anaphylaxis., Disp: 2 each, Rfl: 2   fluticasone (FLONASE) 50 MCG/ACT nasal spray, Place 2 sprays into both nostrils daily., Disp: 16 g, Rfl: 6   ipratropium-albuterol (DUONEB) 0.5-2.5 (3) MG/3ML SOLN, Take 3 mLs by nebulization every 6 (six) hours as needed., Disp: 360 mL, Rfl: 2   montelukast (SINGULAIR) 10 MG tablet, Take 1 tablet (10 mg total) by mouth at  bedtime., Disp: 90 tablet, Rfl: 1   Nebulizers (COMPRESSOR/NEBULIZER) MISC, Inhale 1 kit into the lungs as directed., Disp: , Rfl:    ondansetron (ZOFRAN) 4 MG tablet, Take 1 tablet (4 mg total) by mouth every 8 (eight) hours as needed for nausea or vomiting., Disp: 12 tablet, Rfl: 0   Allergies  Allergen Reactions   Other     TREE NUTS, Positive testing,    Peanut-Containing Drug Products Itching and Swelling    Positive testing   Shellfish Allergy Itching   Amoxicillin Hives   Penicillins Hives, Itching and Swelling    Past Medical History:  Diagnosis Date   Hypothyroidism      History reviewed. No pertinent surgical history.  Family History  Family history unknown: Yes    Social History   Tobacco Use   Smoking status: Never   Smokeless tobacco: Never  Vaping Use   Vaping status: Former  Substance Use Topics   Alcohol use: No   Drug use: No    ROS   Objective:   Vitals: BP 114/73 (BP Location: Right Arm)   Pulse 97   Temp 98.4 F (36.9 C) (Oral)   Resp 18   LMP 12/09/2022 (Exact Date)   SpO2 96%   Physical Exam Constitutional:      General: She is not in acute distress.    Appearance: Normal appearance. She is well-developed. She is not ill-appearing, toxic-appearing or diaphoretic.  HENT:     Head: Normocephalic and atraumatic.  Nose: Nose normal.     Mouth/Throat:     Mouth: Mucous membranes are moist.  Eyes:     General: No scleral icterus.       Right eye: No discharge.        Left eye: No discharge.     Extraocular Movements: Extraocular movements intact.     Conjunctiva/sclera: Conjunctivae normal.  Cardiovascular:     Rate and Rhythm: Normal rate.  Pulmonary:     Effort: Pulmonary effort is normal.  Abdominal:     General: Bowel sounds are normal. There is no distension.     Palpations: Abdomen is soft. There is no mass.     Tenderness: There is no abdominal tenderness. There is no right CVA tenderness, left CVA tenderness,  guarding or rebound.  Skin:    General: Skin is warm and dry.  Neurological:     General: No focal deficit present.     Mental Status: She is alert and oriented to person, place, and time.  Psychiatric:        Mood and Affect: Mood normal.        Behavior: Behavior normal.        Thought Content: Thought content normal.        Judgment: Judgment normal.     Results for orders placed or performed during the hospital encounter of 12/27/22 (from the past 24 hour(s))  POCT urinalysis dipstick     Status: Abnormal   Collection Time: 12/27/22  7:30 PM  Result Value Ref Range   Color, UA yellow yellow   Clarity, UA cloudy (A) clear   Glucose, UA negative negative mg/dL   Bilirubin, UA negative negative   Ketones, POC UA trace (5) (A) negative mg/dL   Spec Grav, UA >=8.657 (A) 1.010 - 1.025   Blood, UA negative negative   pH, UA 6.0 5.0 - 8.0   Protein Ur, POC negative negative mg/dL   Urobilinogen, UA 0.2 0.2 or 1.0 E.U./dL   Nitrite, UA Negative Negative   Leukocytes, UA Negative Negative  POCT urine pregnancy     Status: None   Collection Time: 12/27/22  7:30 PM  Result Value Ref Range   Preg Test, Ur Negative Negative    Assessment and Plan :   PDMP not reviewed this encounter.  1. Bacterial vaginosis   2. Dysuria   3. Acute vaginitis    We will treat patient empirically for bacterial vaginosis with Flagyl and for yeast vaginitis with fluconazole.  Labs pending. Counseled patient on potential for adverse effects with medications prescribed/recommended today, ER and return-to-clinic precautions discussed, patient verbalized understanding.    Wallis Bamberg, [place] 12/27/22 1948

## 2022-12-27 NOTE — Discharge Instructions (Addendum)
Please start metronidazole to address a bacterial vaginosis. Make sure you hydrate very well with plain water and a quantity of 80 ounces of water a day.  Please limit drinks that are considered urinary irritants such as soda, sweet tea, coffee, energy drinks, alcohol.  These can worsen your urinary and genital symptoms but also be the source of them.  I will let you know about your vaginal swab and urine culture results through MyChart to see if we need to prescribe or change your antibiotics based off of those results.

## 2022-12-27 NOTE — ED Triage Notes (Signed)
Pt presents with c/o vaginal odor and creamy discharge X 3 days.  States she feels a burning sensation when he voids and states she does not like using vaginal creams.

## 2022-12-28 LAB — CERVICOVAGINAL ANCILLARY ONLY
Bacterial Vaginitis (gardnerella): POSITIVE — AB
Candida Glabrata: NEGATIVE
Candida Vaginitis: NEGATIVE
Chlamydia: NEGATIVE
Comment: NEGATIVE
Comment: NEGATIVE
Comment: NEGATIVE
Comment: NEGATIVE
Comment: NEGATIVE
Comment: NORMAL
Neisseria Gonorrhea: NEGATIVE
Trichomonas: NEGATIVE

## 2022-12-29 LAB — URINE CULTURE: Culture: 20000 — AB

## 2022-12-30 ENCOUNTER — Telehealth: Payer: Self-pay

## 2022-12-30 MED ORDER — SULFAMETHOXAZOLE-TRIMETHOPRIM 800-160 MG PO TABS: 1.0000 | ORAL_TABLET | Freq: Two times a day (BID) | ORAL | 0 refills | Status: AC

## 2023-01-22 ENCOUNTER — Telehealth: Payer: Self-pay

## 2023-01-22 MED ORDER — AIRSUPRA 90-80 MCG/ACT IN AERO: 2.0000 | INHALATION_SPRAY | RESPIRATORY_TRACT | 0 refills | Status: DC | PRN

## 2023-01-22 MED ORDER — BREZTRI AEROSPHERE 160-9-4.8 MCG/ACT IN AERO: 2.0000 | INHALATION_SPRAY | Freq: Two times a day (BID) | RESPIRATORY_TRACT | 0 refills | Status: DC

## 2023-01-22 NOTE — Telephone Encounter (Signed)
Patient called requesting a refill on Breztri and her Rescue Inhaler.  [facility] [id number] [place]

## 2023-01-22 NOTE — Telephone Encounter (Signed)
Refills have been sent in. Patient will need to keep upcoming follow up for future refills. I called patient and informed.

## 2023-02-05 ENCOUNTER — Emergency Department (HOSPITAL_BASED_OUTPATIENT_CLINIC_OR_DEPARTMENT_OTHER)
Admission: EM | Admit: 2023-02-05 | Discharge: 2023-02-05 | Disposition: A | Discharge status: Home / Self Care | Payer: Medicaid Other | Attending: Emergency Medicine | Admitting: Emergency Medicine

## 2023-02-05 ENCOUNTER — Encounter (HOSPITAL_BASED_OUTPATIENT_CLINIC_OR_DEPARTMENT_OTHER): Payer: Self-pay

## 2023-02-05 ENCOUNTER — Other Ambulatory Visit: Payer: Self-pay

## 2023-02-05 DIAGNOSIS — J358 Other chronic diseases of tonsils and adenoids: Secondary | ICD-10-CM | POA: Diagnosis not present

## 2023-02-05 DIAGNOSIS — Z1152 Encounter for screening for COVID-19: Secondary | ICD-10-CM | POA: Diagnosis not present

## 2023-02-05 DIAGNOSIS — J069 Acute upper respiratory infection, unspecified: Secondary | ICD-10-CM | POA: Insufficient documentation

## 2023-02-05 DIAGNOSIS — Z91010 Allergy to peanuts: Secondary | ICD-10-CM | POA: Diagnosis not present

## 2023-02-05 DIAGNOSIS — J029 Acute pharyngitis, unspecified: Secondary | ICD-10-CM | POA: Diagnosis present

## 2023-02-05 LAB — RESP PANEL BY RT-PCR (RSV, FLU A&B, COVID)  RVPGX2
Influenza A by PCR: NEGATIVE
Influenza B by PCR: NEGATIVE
Resp Syncytial Virus by PCR: NEGATIVE
SARS Coronavirus 2 by RT PCR: NEGATIVE

## 2023-02-05 LAB — GROUP A STREP BY PCR: Group A Strep by PCR: NOT DETECTED

## 2023-02-05 IMAGING — CR DG CHEST 2V
2 series · 2 of 2 positions shown · non-contrast
Comparison: None Available.

CLINICAL DATA: Cough and wheezing.  Asthma exacerbation.

EXAM:
CHEST - 2 VIEW

[chest pa]
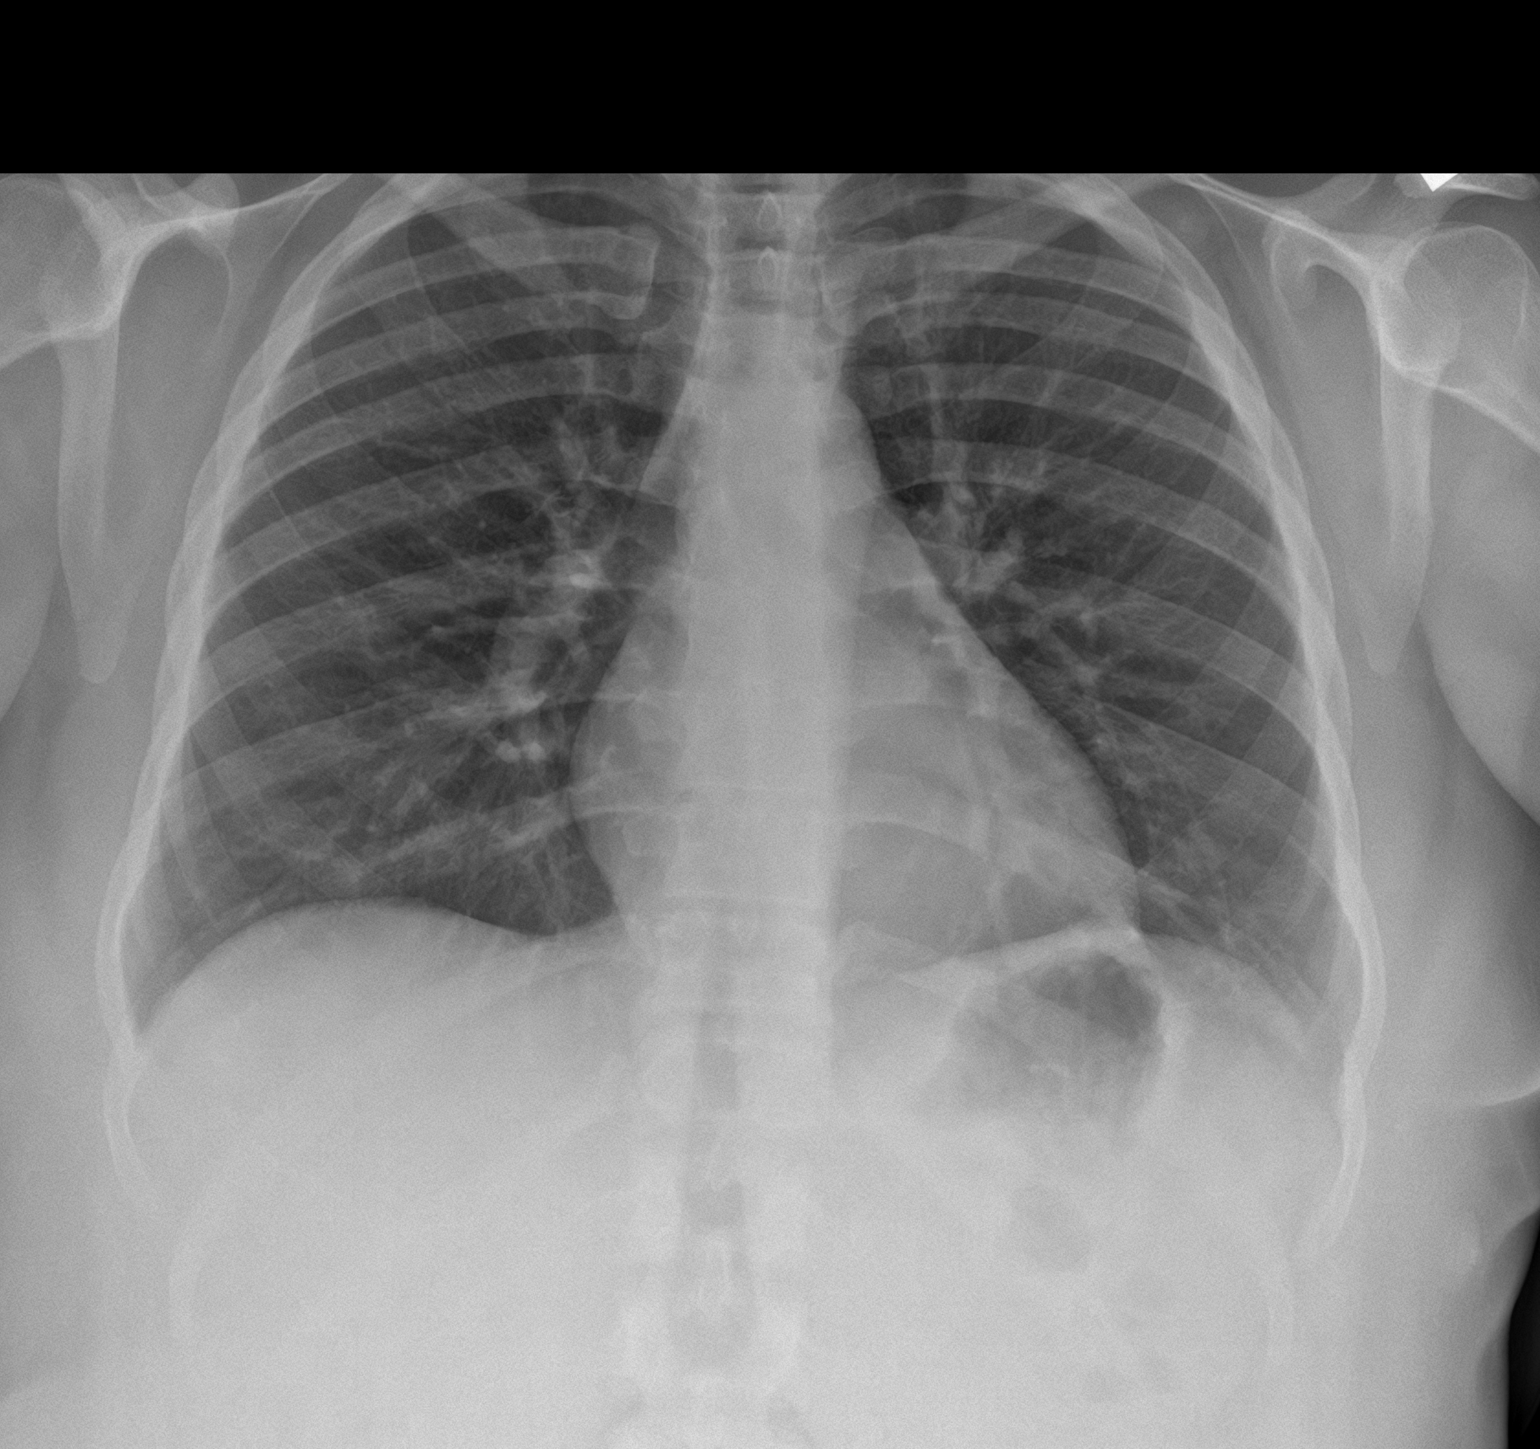

[chest lat]
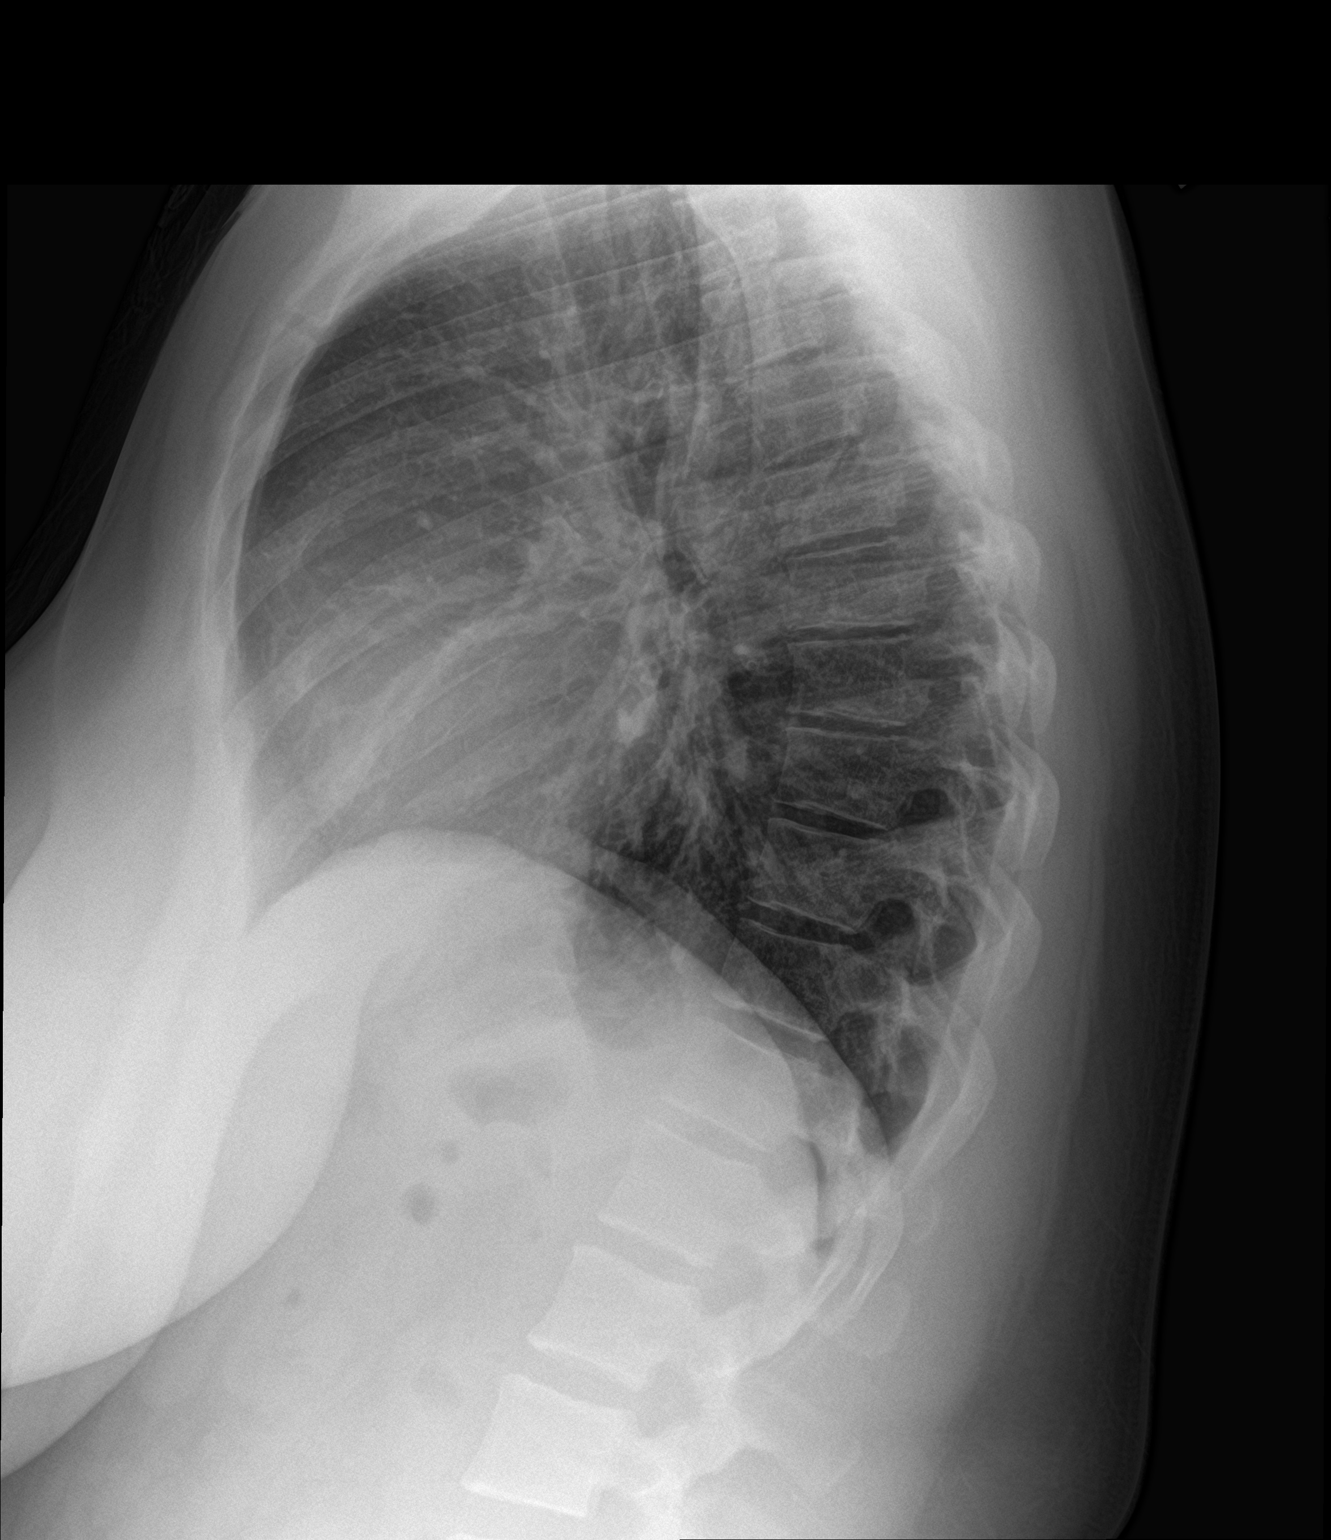

[2 of 2 positions shown; findings below may reference images not displayed]

FINDINGS: The heart size and mediastinal contours are within normal limits.
Both lungs are clear. The visualized skeletal structures are
unremarkable.
IMPRESSION: No active cardiopulmonary disease.

## 2023-02-05 NOTE — ED Provider Notes (Signed)
[place] [facility] Provider Note   CSN: [id number] Arrival date & time: 02/05/23  1023     History  Chief Complaint  Patient presents with   Sore Throat    Quaniqua Chaffee is a 32 y.o. female.   Sore Throat     32 year old female presenting to the emergency department with a sore throat.  She states that she has had symptoms of a viral upper respiratory infection with nasal congestion, headaches, mildly productive cough for the last 2 days.  She also states that she saw in the mirror a tonsillar stone on the left.  She denies any fevers, she denies any difficulty with range of motion of the neck, difficulty swallowing or speaking.  No unilateral nature to her throat pain.  Home Medications Prior to Admission medications   Medication Sig Start Date End Date Taking? Authorizing Provider  albuterol (PROVENTIL) (2.5 MG/3ML) 0.083% nebulizer solution Take 3 mLs (2.5 mg total) by nebulization every 4 (four) hours as needed for wheezing or shortness of breath. 12/31/21 12/31/22  Shaune Pollack, MD  Albuterol-Budesonide (AIRSUPRA) 90-80 MCG/ACT AERO Inhale 2 puffs into the lungs as needed (maximum 12 puffs/day). 01/22/23   Verlee Monte, MD  azelastine (ASTELIN) 0.1 % nasal spray Place 2 sprays into both nostrils 2 (two) times daily. 02/15/22   [provider]  Budeson-Glycopyrrol-Formoterol (BREZTRI AEROSPHERE) 160-9-4.8 MCG/ACT AERO Inhale 2 puffs into the lungs in the morning and at bedtime. 01/22/23   Verlee Monte, MD  dupilumab (DUPIXENT) 300 MG/2ML prefilled syringe Inject 300 mg into the skin every 14 (fourteen) days. 08/03/22   Verlee Monte, MD  EPINEPHrine (EPIPEN 2-PAK) 0.3 mg/0.3 mL IJ SOAJ injection Inject 0.3 mg into the muscle as needed for anaphylaxis. 05/16/22   Verlee Monte, MD  fluconazole (DIFLUCAN) 150 MG tablet Take 1 tablet (150 mg total) by mouth once a week. 12/27/22   Wallis Bamberg, PA-C  fluticasone (FLONASE) 50 MCG/ACT  nasal spray Place 2 sprays into both nostrils daily. 06/28/21   Larae Grooms, NP  ipratropium-albuterol (DUONEB) 0.5-2.5 (3) MG/3ML SOLN Take 3 mLs by nebulization every 6 (six) hours as needed. 06/04/22   Verlee Monte, MD  metroNIDAZOLE (FLAGYL) 500 MG tablet Take 1 tablet (500 mg total) by mouth 2 (two) times daily with a meal. DO NOT CONSUME ALCOHOL WHILE TAKING THIS MEDICATION. 12/27/22   Wallis Bamberg, PA-C  montelukast (SINGULAIR) 10 MG tablet Take 1 tablet (10 mg total) by mouth at bedtime. 06/20/22   Verlee Monte, MD  Nebulizers (COMPRESSOR/NEBULIZER) MISC Inhale 1 kit into the lungs as directed. 02/15/22   [provider]  ondansetron (ZOFRAN) 4 MG tablet Take 1 tablet (4 mg total) by mouth every 8 (eight) hours as needed for nausea or vomiting. 07/04/22   Fayrene Helper, PA-C      Allergies    Other, Peanut-containing drug products, Shellfish allergy, Amoxicillin, and Penicillins    Review of Systems   Review of Systems  All other systems reviewed and are negative.   Physical Exam Updated Vital Signs BP 133/86 (BP Location: Left Arm)   Pulse 88   Temp 98.7 F (37.1 C) (Oral)   Resp 18   Ht 5' (1.524 m)   Wt 76.2 kg   SpO2 100%   BMI 32.81 kg/m  Physical Exam Vitals and nursing note reviewed.  Constitutional:      General: She is not in acute distress.    Appearance: She  is well-developed.  HENT:     Head: Normocephalic and atraumatic.     Mouth/Throat:     Pharynx: Posterior oropharyngeal erythema present.     Tonsils: No tonsillar exudate or tonsillar abscesses.     Comments: Bilateral oropharyngeal erythema, no evidence of PTA, tonsils with a apparent tonsillary stone on the left Eyes:     Conjunctiva/sclera: Conjunctivae normal.  Cardiovascular:     Rate and Rhythm: Normal rate and regular rhythm.     Heart sounds: No murmur heard. Pulmonary:     Effort: Pulmonary effort is normal. No respiratory distress.     Breath sounds: Normal breath sounds.   Abdominal:     Palpations: Abdomen is soft.     Tenderness: There is no abdominal tenderness.  Musculoskeletal:        General: No swelling.     Cervical back: Neck supple.  Skin:    General: Skin is warm and dry.     Capillary Refill: Capillary refill takes less than 2 seconds.  Neurological:     Mental Status: She is alert.  Psychiatric:        Mood and Affect: Mood normal.     ED Results / Procedures / Treatments   Labs (all labs ordered are listed, but only abnormal results are displayed) Labs Reviewed  GROUP A STREP BY PCR  RESP PANEL BY RT-PCR (RSV, FLU A&B, COVID)  [id number]    EKG None  Radiology No results found.  Procedures Procedures    Medications Ordered in ED Medications - No data to display  ED Course/ Medical Decision Making/ A&P                                 Medical Decision Making   32 year old female presenting to the emergency department with a sore throat.  She states that she has had symptoms of a viral upper respiratory infection with nasal congestion, headaches, mildly productive cough for the last 2 days.  She also states that she saw in the mirror a tonsillar stone on the left.  She denies any fevers, she denies any difficulty with range of motion of the neck, difficulty swallowing or speaking.  No unilateral nature to her throat pain.    On arrival, the patient was vitally stable, afebrile, not tachycardic or tachypneic, BP 133/86, saturating her percent on room air.  Patient presenting with 2 days of a pharyngitis and symptoms of a viral URI.  Lungs clear to auscultation on exam, mildly productive cough, given short duration of symptoms, will not obtain chest x-ray imaging has lower suspicion for pneumonia.  COVID-19 and influenza PCR testing was collected and resulted negative.  Group A strep PCR testing was collected and was also negative.  Patient with likely viral URI and subsequent pharyngitis.  On exam, no evidence of peritonsillar  abscess.  She does have evidence of a tonsillar stone, advised salt water gargles and lozenges and follow-up with a primary care provider.  Advised symptomatic management of her viral upper respiratory infection.  I discussed symptomatic management, including hydration, motrin, and tylenol. The patient felt safe being discharged from the ED.  They agreed to followup with the PCP if needed.  I provided ED return precautions.    Final Clinical Impression(s) / ED Diagnoses Final diagnoses:  Tonsil stone  Upper respiratory tract infection, unspecified type    Rx / DC Orders ED Discharge Orders  None         Ernie Avena, MD 02/05/23 1324

## 2023-02-05 NOTE — ED Triage Notes (Signed)
Patient here POV from Home.  Endorses Sore Throat for 2 Days. Associated with Congestion, Headache/Migraine, Cough (dark productivity).  Also notes Possible Tonsillar stones that she would like removed if possible. No Fevers. No N/V/D.   NAD Noted during Triage. A&Ox4. GCS 15. Ambulatory.

## 2023-02-05 NOTE — Discharge Instructions (Addendum)
Your strep, COVID and flu was negative.  Your symptoms are consistent with viral upper respiratory infection. Recommend Tylenol and Ibuprofen for pain control, continue oral rehydration. Recommend salt water gargles for your sore throat. Follow-up with your primary care provider

## 2023-02-11 ENCOUNTER — Encounter: Payer: Self-pay | Admitting: Internal Medicine

## 2023-02-11 ENCOUNTER — Telehealth: Payer: Self-pay

## 2023-02-11 ENCOUNTER — Other Ambulatory Visit: Payer: Self-pay

## 2023-02-11 ENCOUNTER — Ambulatory Visit (INDEPENDENT_AMBULATORY_CARE_PROVIDER_SITE_OTHER): Payer: Medicaid Other | Admitting: Internal Medicine

## 2023-02-11 VITALS — BP 102/64 | HR 90 | Temp 98.6°F | Ht 59.45 in | Wt 191.2 lb

## 2023-02-11 DIAGNOSIS — J3089 Other allergic rhinitis: Secondary | ICD-10-CM | POA: Diagnosis not present

## 2023-02-11 DIAGNOSIS — T781XXD Other adverse food reactions, not elsewhere classified, subsequent encounter: Secondary | ICD-10-CM | POA: Diagnosis not present

## 2023-02-11 DIAGNOSIS — J4540 Moderate persistent asthma, uncomplicated: Secondary | ICD-10-CM

## 2023-02-11 DIAGNOSIS — H1013 Acute atopic conjunctivitis, bilateral: Secondary | ICD-10-CM

## 2023-02-11 DIAGNOSIS — J302 Other seasonal allergic rhinitis: Secondary | ICD-10-CM | POA: Diagnosis not present

## 2023-02-11 DIAGNOSIS — Z880 Allergy status to penicillin: Secondary | ICD-10-CM

## 2023-02-11 MED ORDER — AZELASTINE HCL 0.1 % NA SOLN: 2.0000 | Freq: Two times a day (BID) | NASAL | 5 refills | Status: DC | PRN

## 2023-02-11 MED ORDER — DUPILUMAB 300 MG/2ML ~~LOC~~ SOSY
300.0000 mg | PREFILLED_SYRINGE | Freq: Once | SUBCUTANEOUS | Status: AC
Start: 2023-02-11 — End: 2023-02-11
  Administered 2023-02-11: 300 mg via SUBCUTANEOUS

## 2023-02-11 MED ORDER — BREZTRI AEROSPHERE 160-9-4.8 MCG/ACT IN AERO: 2.0000 | INHALATION_SPRAY | Freq: Two times a day (BID) | RESPIRATORY_TRACT | 2 refills | Status: DC

## 2023-02-11 MED ORDER — ALBUTEROL SULFATE HFA 108 (90 BASE) MCG/ACT IN AERS: 2.0000 | INHALATION_SPRAY | Freq: Four times a day (QID) | RESPIRATORY_TRACT | 2 refills | Status: DC | PRN

## 2023-02-11 MED ORDER — FLUTICASONE PROPIONATE 50 MCG/ACT NA SUSP: 2.0000 | Freq: Every day | NASAL | 6 refills | Status: AC

## 2023-02-11 MED ORDER — ALBUTEROL SULFATE (2.5 MG/3ML) 0.083% IN NEBU: 2.5000 mg | INHALATION_SOLUTION | RESPIRATORY_TRACT | 0 refills | Status: DC | PRN

## 2023-02-11 MED ORDER — EPINEPHRINE 0.3 MG/0.3ML IJ SOAJ: 0.3000 mg | INTRAMUSCULAR | 2 refills | Status: AC | PRN

## 2023-02-11 MED ORDER — IPRATROPIUM-ALBUTEROL 0.5-2.5 (3) MG/3ML IN SOLN: 3.0000 mL | Freq: Four times a day (QID) | RESPIRATORY_TRACT | 2 refills | Status: AC | PRN

## 2023-02-11 MED ORDER — MONTELUKAST SODIUM 10 MG PO TABS: 10.0000 mg | ORAL_TABLET | Freq: Every day | ORAL | 1 refills | Status: DC

## 2023-02-11 NOTE — Patient Instructions (Addendum)
Severe Persistent Asthma: - Controller Inhaler:  Restart  Breztri 160 mcg 2 puffs twice a day; This Should Be Used Everyday - Rinse mouth out after use - use with a spacer -Singulair (montelukast) continue 10 mg daily - stop if nightmares of mood changes. - Rescue Inhaler:  albuterol  2-4 puffs every 4 hours as needed or  1 vial albuterol Use  every 4-6 hours as needed for chest tightness, wheezing, or coughing.  Can also use 15 minutes prior to exercise if you have symptoms with activity. Or 1 vial duonebs - every 6 to 8 hours as needed - Asthma is not controlled if:  - Symptoms are occurring >2 times a week OR  - >2 times a month nighttime awakenings  - You are requiring systemic steroids (prednisone/steroid injections) more than once per year  - Your require hospitalization for your asthma.  - Please call the clinic to schedule a follow up if these symptoms arise -restart dupixent injections-sample given today. Loading dose 600 mg   Seasonal and Perennial Allergic Rhinitis with anosmia without nasal polyps - 2024 allergy testing positive to grass pollen, weed pollen, tree pollen, outdoor molds, cat, dog and horse; cockroach, mold mix 2 (indoor molds), dust mites and ragweed mix - Prevention:  - allergen avoidance when possible - consider allergy shots as long term control of your symptoms by teaching your immune system to be more tolerant of your allergy triggers  - asthma must first be controlled - Symptom control: First use saline rinses twice daily then use flonase and astelin. -  Restart  Singulair (Montelukast) 10mg  nightly.   - Discontinue if nightmares of behavior changes. -  Restart  Antihistamine: Zyrtec (Cetirizine) 10 mg daily as needed - Can be purchased over-the-counter if not covered by insurance. - Try [employer] Smell training kit to see if it helps  Allergic Conjunctivitis:  - Consider  Pataday (Olopatadine) for eye symptoms daily as needed  Food allergy:  - please  strictly avoid shellfish and peanuts and tree nuts - for SKIN only reaction, okay to take Benadryl 2 capsules every 4 hours - for SKIN + ANY additional symptoms, OR IF concern for LIFE THREATENING reaction = Epipen Autoinjector EpiPen 0.3 mg. - If using Epinephrine autoinjector, call 911 - A food allergy action plan has been provided and discussed. - Medic Alert identification is recommended.  Oral Allergy Syndrome (watermelon, pineapple): Avoid all raw fruits and vegetables that bother you. Allergy injections may improve these symptoms. - Heating these foods, buying them canned, and peeling these foods should allow them to be consumed without symptoms or with less symptoms.  H/O Penicillin allergy: Asthma must first be controlled. - please schedule follow-up appt at your convenience for penicillin testing followed by graded oral challenge if indicated   Follow up : 4 months, sooner if needed. Consider AIT if doing well at that time. Thank you for allowing me to participate in your care.  Tonny Bollman, MD [facility]  Reducing Pollen Exposure  The American Academy of Allergy, Asthma and Immunology suggests the following steps to reduce your exposure to pollen during allergy seasons.    Do not hang sheets or clothing out to dry; pollen may collect on these items. Do not mow lawns or spend time around freshly cut grass; mowing stirs up pollen. Keep windows closed at night.  Keep car windows closed while driving. Minimize morning activities outdoors, a time when pollen counts are usually at their highest.  Stay indoors as much as possible when pollen counts or humidity is high and on windy days when pollen tends to remain in the air longer. Use air conditioning when possible.  Many air conditioners have filters that trap the pollen spores. Use a HEPA room air filter to remove pollen form the indoor air you breathe. Control of Dog or Cat Allergen  Avoidance is the best  way to manage a dog or cat allergy. If you have a dog or cat and are allergic to dog or cats, consider removing the dog or cat from the home. If you have a dog or cat but don't want to find it a new home, or if your family wants a pet even though someone in the household is allergic, here are some strategies that may help keep symptoms at bay:  Keep the pet out of your bedroom and restrict it to only a few rooms. Be advised that keeping the dog or cat in only one room will not limit the allergens to that room. Don't pet, hug or kiss the dog or cat; if you do, wash your hands with soap and water. High-efficiency particulate air (HEPA) cleaners run continuously in a bedroom or living room can reduce allergen levels over time. Regular use of a high-efficiency vacuum cleaner or a central vacuum can reduce allergen levels. Giving your dog or cat a bath at least once a week can reduce airborne allergen. Control of Mold Allergen   Mold and fungi can grow on a variety of surfaces provided certain temperature and moisture conditions exist.  Outdoor molds grow on plants, decaying vegetation and soil.  The major outdoor mold, Alternaria and Cladosporium, are found in very high numbers during hot and dry conditions.  Generally, a late Summer - Fall peak is seen for common outdoor fungal spores.  Rain will temporarily lower outdoor mold spore count, but counts rise rapidly when the rainy period ends.  The most important indoor molds are Aspergillus and Penicillium.  Dark, humid and poorly ventilated basements are ideal sites for mold growth.  The next most common sites of mold growth are the bathroom and the kitchen.  Outdoor (Seasonal) Mold Control  Use air conditioning and keep windows closed Avoid exposure to decaying vegetation. Avoid leaf raking. Avoid grain handling. Consider wearing a face mask if working in moldy areas.    Indoor (Perennial) Mold Control   Maintain humidity below 50%. Clean  washable surfaces with 5% bleach solution. Remove sources e.g. contaminated carpets. DUST MITE AVOIDANCE MEASURES:  There are three main measures that need and can be taken to avoid house dust mites:  Reduce accumulation of dust in general -reduce furniture, clothing, carpeting, books, stuffed animals, especially in bedroom  Separate yourself from the dust -use pillow and mattress encasements (can be found at stores such as [employer] or online) -avoid direct exposure to air condition flow -use a HEPA filter device, especially in the bedroom; you can also use a HEPA filter vacuum cleaner -wipe dust with a moist towel instead of a dry towel or broom when cleaning  Decrease mites and/or their secretions -wash clothing and linen and stuffed animals at highest temperature possible, at least every 2 weeks -stuffed animals can also be placed in a bag and put in a freezer overnight  Despite the above measures, it is impossible to eliminate dust mites or their allergen completely from your home.  With the above measures the burden of mites in your home  can be diminished, with the goal of minimizing your allergic symptoms.  Success will be reached only when implementing and using all means together.

## 2023-02-11 NOTE — Progress Notes (Signed)
Immunotherapy   Patient Details  Name: Sonya Castro MRN: [id number] Date of Birth: April 29, 1990  02/11/2023  [name] started injections for  Dupixent  Frequency: Every 2 Weeks  Epi-Pen: Not Required  Consent signed and patient instructions given. Patient re-started Dupixent and received 600mg  loading dose, 300mg  in each arm. Patient waited 30 minutes in office per newly implemented protocol. Patient did not experience any issues.    Daris Harkins Fernandez-Vernon 02/11/2023, 12:12 PM

## 2023-02-11 NOTE — Telephone Encounter (Signed)
*  Asthma/Allergy  Pharmacy Patient Advocate Encounter   Received notification from CoverMyMeds that prior authorization for Breztri Aerosphere 160-9-4.8MCG/ACT aerosol  is required/requested.   Insurance verification completed.   The patient is insured through [facility] .   Per test claim: PA required; PA submitted to above mentioned insurance via CoverMyMeds Key/confirmation #/EOC [id number] Status is pending

## 2023-02-11 NOTE — Progress Notes (Signed)
FOLLOW UP Date of Service/Encounter:  02/11/23  Subjective:  Sonya Castro (DOB: [birth year]) is a 32 y.o. female [facility] in re-evaluation of the following:  asthma, food allergies, allergic rhinitis, penicillin allergy  History obtained from: chart review and patient.  For Review, LV was on 06/20/22  with Dr.Rudine Rieger seen for routine follow-up. See below for summary of history and diagnostics.   Therapeutic plans/changes recommended: FEV1 110% ----------------------------------------------------- Pertinent History/Diagnostics:  Asthma: Diagnosed as an adult, age 89 yo. Using rescue inhaler multiple times per day at work.  Triggers: fumes, scents, dog-has dog in the home. Roaches. exercise  AEC 600 on 04/21/21. She received prednisone at least 6 times in the past year.  Seen in ED 4 times for asthma/bronchitis in 2023. -  spirometry (05/16/22): ratio 0.67, 94% FEV1 (pre), 87% FEV1 (post)-mild obstruction without postbronchodilator response - labs: 05/16/22: IgE 2410, AEC 400 -06/20/22: planned to start dupixent, continue Breztri 160 mcg 2 puffs BID, singulair and Airsupra PRN. Dupi approved in March, but never started. Allergic Rhinitis:  Ongoing nasal congestion, hyposmia. Right nostril is harder for her breath.  She can't taste and smell.  She has been having trouble with this for 3.5 months.  No ENT surgeries.  - SPT environmental panel (05/16/22):  skin testing positive to grass pollen, weed pollen, tree pollen, outdoor molds, cat, dog and horse Intradermal testing was positive to cockroach, mold mix 2 (indoor molds), dust - 06/20/22-rehomed her dog and put in air purifier and reported improved symptoms Plan: singulair, OTC AH, consider AIT once asthma controlled, pataday PRN Pollen food allergy syndrome: Pineapple, watermelon-makes her itchy.  She continues to eat these fruits despite these symptoms. Only happens when raw and  fresh. Food Allergies-nuts and shellfish Shellfish: she gets itchy all over, including her throat. She has avoided for over one year. Does not carry an epipen.  Peanuts: tongue swelling and mouth itching immediately after eating a snickers bar - SPT negative to shellfish 05/16/22. - labs 05/16/22: positive to shrimp 4.62, crab 0.31, clam 0.26, scallop 0.30, oyster 0.24, lobster 0.16 - labs 06/05/22: peanut 8.89 (ara h2 0.13, ara h8 13.40, arah9 2.53), hazelnut 18, walnut 1.56, cashew 0.37, [place] nut 0.24, macadamia 1.69, pecan 0.42, pistachio 1.48, almond 3.94 Penicillin allergy:  Hives and sensation of throat closing. Last given in childhood.  Symptoms lasted around 2.5 days.  Testing and challenge offered --------------------------------------------------- Today presents for follow-up. Discussed the use of AI scribe software for clinical note transcription with the patient, who gave verbal consent to proceed.  History of Present Illness   The patient, previously on Dupixent for an unspecified condition, reports a lapse in therapy due to changes in employment and insurance. She notes that the medication was effective and beneficial during the period of use. The last injection was administered in July, and the patient has not received any doses since then. The patient also reports a recent change in living conditions due to safety concerns at her previous residence.  The patient has been off all medications, including Breztri, which she found beneficial. She has been trying to maintain lung health through long walks, but notes some difficulty. The patient also reports noting that her asthma recently has been triggered by certain scents and aromas.  The patient has been experiencing post-nasal drip, which she attributes to her current living conditions. She also reports a loss of smell and taste, which has persisted despite previous ENT consultations and CT  scans. The patient has not been  diagnosed with COVID-19. She has not had polyps found on her ENT evluation or CT scans.  The patient also reports a recent episode of tonsil stones, which she managed to remove herself. She has been experiencing some discomfort due to this. The patient has a history of allergies to peanuts, shellfish, and tree nuts, and has been prescribed an EpiPen for emergency use. She has not had any accidental exposures. She has new insurance and would like to restart her previous medications, including dupixent.     Chart Review: ED visit 02/05/23: "likely viral URI and subsequent pharyngitis." Plan: symptomatic care. 09/21/22: CT facial bones wo contrast:"Sinuses: Clear.  IMPRESSION:  Paranasal sinuses are clear. "  All medications reviewed by clinical staff and updated in chart. No new pertinent medical or surgical history except as noted in HPI.  ROS: All others negative except as noted per HPI.   Objective:  BP 102/64   Pulse 90   Temp 98.6 F (37 C) (Temporal)   Ht 4' 11.45" (1.51 m)   Wt 191 lb 3.2 oz (86.7 kg)   SpO2 99%   BMI 38.04 kg/m  Body mass index is 38.04 kg/m. Physical Exam: General Appearance:  Alert, cooperative, no distress, appears stated age  Head:  Normocephalic, without obvious abnormality, atraumatic  Eyes:  Conjunctiva clear, EOM's intact  Ears EACs normal bilaterally and normal TMs bilaterally  Nose: Nares normal, hypertrophic turbinates, normal mucosa, and no visible anterior polyps  Throat: Lips, tongue normal; teeth and gums normal, normal posterior oropharynx and tonsils 3+  Neck: Supple, symmetrical  Lungs:   clear to auscultation bilaterally, Respirations unlabored, no coughing  Heart:  regular rate and rhythm and no murmur, Appears well perfused  Extremities: No edema  Skin: Skin color, texture, turgor normal and no rashes or lesions on visualized portions of skin  Neurologic: No gross deficits   Labs:  Lab Orders  No laboratory test(s) ordered today     Spirometry:  Tracings reviewed. Her effort: Good reproducible efforts. FVC: 2.68L FEV1: 2.21L, 95% predicted FEV1/FVC ratio: 0.82 Interpretation: Spirometry consistent with normal pattern.  Please see scanned spirometry results for details.  Assessment/Plan   Severe Persistent Asthma: not at goal - Controller Inhaler: restart Breztri 160 mcg 2 puffs twice a day; This Should Be Used Everyday - Rinse mouth out after use - use with a spacer -Singulair (montelukast) continue 10 mg daily - stop if nightmares of mood changes. - Rescue Inhaler: albuterol 2-4 puffs every 4 hours as needed or  1 vial albuterol Use  every 4-6 hours as needed for chest tightness, wheezing, or coughing.  Can also use 15 minutes prior to exercise if you have symptoms with activity. Or 1 vial duonebs - every 6 to 8 hours as needed - Asthma is not controlled if:  - Symptoms are occurring >2 times a week OR  - >2 times a month nighttime awakenings  - You are requiring systemic steroids (prednisone/steroid injections) more than once per year  - Your require hospitalization for your asthma.  - Please call the clinic to schedule a follow up if these symptoms arise -restart dupixent injections-sample given today. Loading dose 600 mg   Seasonal and Perennial Allergic Rhinitis with anosmia without nasal polyps-not at goal - 2024 allergy testing positive to grass pollen, weed pollen, tree pollen, outdoor molds, cat, dog and horse; cockroach, mold mix 2 (indoor molds), dust mites and ragweed mix - Prevention:  - allergen  avoidance when possible - consider allergy shots as long term control of your symptoms by teaching your immune system to be more tolerant of your allergy triggers  - asthma must first be controlled - Symptom control: First use saline rinses twice daily then use flonase and astelin. - Restart Singulair (Montelukast) 10mg  nightly.   - Discontinue if nightmares of behavior changes. - Restart  Antihistamine: Zyrtec (Cetirizine) 10 mg daily as needed - Can be purchased over-the-counter if not covered by insurance. - Try [employer] Smell training kit to see if it helps-sample given  Allergic Conjunctivitis: stable - Consider  Pataday (Olopatadine) for eye symptoms daily as needed  Food allergy: stable - please strictly avoid shellfish and peanuts and tree nuts - for SKIN only reaction, okay to take Benadryl 2 capsules every 4 hours - for SKIN + ANY additional symptoms, OR IF concern for LIFE THREATENING reaction = Epipen Autoinjector EpiPen 0.3 mg. - If using Epinephrine autoinjector, call 911 - A food allergy action plan has been provided and discussed. - Medic Alert identification is recommended.  Oral Allergy Syndrome (watermelon, pineapple):stable Avoid all raw fruits and vegetables that bother you. Allergy injections may improve these symptoms. - Heating these foods, buying them canned, and peeling these foods should allow them to be consumed without symptoms or with less symptoms.  H/O Penicillin allergy: Asthma must first be controlled. - please schedule follow-up appt at your convenience for penicillin testing followed by graded oral challenge if indicated   Follow up : 4 months, sooner if needed. Consider AIT if doing well at that time. Thank you for allowing me to participate in your care.  Tonny Bollman, MD [facility]   Other: biologic given in clinic today-dupixent 600  mcg, neil med smell kit, breztri samples also provided.  Tonny Bollman, MD  [facility] [place]

## 2023-02-12 NOTE — Telephone Encounter (Signed)
Forwarding message to provider for next step. 

## 2023-02-12 NOTE — Telephone Encounter (Signed)
Pharmacy Patient Advocate Encounter  Received notification from [facility] that Prior Authorization for [facility] Aerosphere 160-9-4.8MCG/ACT aerosol  has been DENIED.  Full denial letter will be uploaded to the media tab. See denial reason below.   PA #/Case ID/Reference #:

## 2023-02-13 ENCOUNTER — Telehealth: Payer: Self-pay | Admitting: *Deleted

## 2023-02-13 MED ORDER — BUDESONIDE-FORMOTEROL FUMARATE 160-4.5 MCG/ACT IN AERO: 2.0000 | INHALATION_SPRAY | Freq: Two times a day (BID) | RESPIRATORY_TRACT | 5 refills | Status: DC

## 2023-02-13 MED ORDER — SPIRIVA RESPIMAT 1.25 MCG/ACT IN AERS: 2.0000 | INHALATION_SPRAY | Freq: Every day | RESPIRATORY_TRACT | 5 refills | Status: DC

## 2023-02-13 NOTE — Telephone Encounter (Signed)
L/m for patient to advise approval and submit for Dupixent to [facility]

## 2023-02-13 NOTE — Telephone Encounter (Signed)
-----   Message from Verlee Monte sent at 02/11/2023 11:27 AM EST ----- She would like to restart Dupixent. I gave her 600 mg loading dose today for asthma. She is now medicaid.

## 2023-02-13 NOTE — Addendum Note (Signed)
Addended by: Elsworth Soho on: 02/13/2023 11:25 AM   Modules accepted: Orders

## 2023-02-13 NOTE — Telephone Encounter (Signed)
Please send Symbicort 160 2 puffs BID + spiriva 1.25 mcg 2 puffs daily in place of [place]

## 2023-02-13 NOTE — Telephone Encounter (Signed)
I called patient and informed Sonya Castro has been denied. Informed to start symbicort and spiriva. I verified pharmacy for patient and medications have now been sent in.

## 2023-03-06 NOTE — Telephone Encounter (Signed)
L/m for patient to contact me again ?

## 2023-03-13 ENCOUNTER — Encounter: Payer: Self-pay | Admitting: *Deleted

## 2023-03-13 NOTE — Telephone Encounter (Signed)
My chart message sent as last resort

## 2023-03-18 NOTE — Telephone Encounter (Signed)
No response from patient

## 2023-03-21 ENCOUNTER — Encounter: Payer: Medicaid Other | Admitting: Family Medicine

## 2023-04-22 ENCOUNTER — Telehealth: Payer: Self-pay

## 2023-04-22 NOTE — Telephone Encounter (Signed)
 Pharmacy Patient Advocate Encounter  Received notification from [facility] that Prior Authorization for Airsupra  90-80MCG/ACT aerosol  has been CANCELLED due to medication is discontinued on patients chart-notes that patient must be seen at next appt.    PA #/Case ID/Reference #: [id number]

## 2023-05-03 ENCOUNTER — Other Ambulatory Visit: Payer: Self-pay | Admitting: Internal Medicine

## 2023-05-03 MED ORDER — AMOXICILLIN 400 MG/5ML PO SUSR: ORAL | 0 refills | Status: DC

## 2023-05-03 NOTE — Progress Notes (Signed)
Amoxicillin sent for challenge.

## 2023-05-06 ENCOUNTER — Encounter: Payer: Medicaid Other | Admitting: Family Medicine

## 2023-05-06 NOTE — Progress Notes (Deleted)
   [address] [place] [address] Dept: [phone]  FOLLOW UP NOTE  Patient ID: Sonya Castro, female    DOB: 1990-12-09  Age: 33 y.o. MRN: [id number] Date of Office Visit: 05/06/2023  Assessment  Chief Complaint: No chief complaint on file.  HPI Sonya Castro is a 33 year old female who presents to the clinic for follow-up visit.  She was last seen in this clinic on 02/11/2023 by Dr. Maurine Minister for evaluation of asthma, allergic rhinitis, allergic conjunctivitis, oral allergy syndrome, food allergy to fish, peanut, and tree nuts, and penicillin allergy.  Discussed the use of AI scribe software for clinical note transcription with the patient, who gave verbal consent to proceed.  History of Present Illness             Drug Allergies:  Allergies  Allergen Reactions   Other     TREE NUTS, Positive testing,    Peanut-Containing Drug Products Itching and Swelling    Positive testing   Shellfish Allergy Itching   Amoxicillin Hives   Penicillins Hives, Itching and Swelling    Physical Exam: There were no vitals taken for this visit.   Physical Exam  Diagnostics:   Percutaneous Penicillin Testing Control SPT: {Blank single:19197::"2+","3+","4+","NOT PERFORMED","negative"} Histamine SPT: {Blank single:19197::"negative","3+","4+","NOT PERFORMED","2+"} Pre-Pen SPT: {Blank single:19197::"2+","3+","4+","NOT PERFORMED","negative"} Pen-G SPT: {Blank single:19197::"2+","3+","4+","NOT PERFORMED","negative"}  Intradermal Penicillin Testing Control ID: {Blank single:19197::"2+","3+","4+","NOT PERFORMED","negative"}  Pre-Pen ID: {Blank single:19197::"2+","3+","4+","NOT PERFORMED","negative"} Pen-G (50 units/mL) ID: {Blank single:19197::"2+","3+","4+","NOT PERFORMED","negative"} Pen-G (500 units/mL) ID: {Blank single:19197::"2+","3+","4+","NOT PERFORMED","negative"} Pen-G (5000 units/mL) ID: {Blank single:19197::"2+","3+","4+","NOT PERFORMED","negative"}  ***Testing was negative,  therefore the patient will proceed with a penicillin challenge at a future visit.   Procedure note:  Written consent obtained  {Blank single:19197::"Open graded *** challenge","Open graded *** oral challenge"}: The patient was able to tolerate the challenge today without adverse signs or symptoms. Vital signs were stable throughout the challenge and observation period. She received multiple doses separated by {Blank single:19197::"30 minutes","20 minutes","15 minutes","10 minutes"}, each of which was separated by vitals and a brief physical exam. She received the following doses: lip rub, 1 gm, 2 gm, 4 gm, 8 gm, and 16 gm. She was monitored for 60 minutes following the last dose.  Total testing time:  The patient had {Blank single:19197::"***","negative skin prick test and sIgE tests to ***","negative sIgE tests to ***","negative skin prick tests to ***"} and was able to tolerate the open graded oral challenge today without adverse signs or symptoms. Therefore, she has the same risk of systemic reaction associated with {Blank single:19197::"***","the consumption of ***"} as the general population.   Assessment and Plan: No diagnosis found.  No orders of the defined types were placed in this encounter.   There are no Patient Instructions on file for this visit.  No follow-ups on file.    Thank you for the opportunity to care for this patient.  Please do not hesitate to contact me with questions.  Thermon Leyland, FNP [facility]

## 2023-05-06 NOTE — Patient Instructions (Incomplete)
***   was able to tolerate the *** food challenge today at the office without adverse signs or symptoms of an allergic reaction. Therefore, *** has the same risk of systemic reaction associated with the consumption of *** as the general population.  - Do not give any ***  for the next 24 hours. - Monitor for allergic symptoms such as rash, wheezing, diarrhea, swelling, and vomiting for the next 24 hours. If severe symptoms occur, treat with EpiPen injection and call 911. For less severe symptoms treat with Benadryl *** teaspoonfuls every *** hours and call the clinic.  - If no allergic symptoms are evident, reintroduce ***  into the diet. If *** develops an allergic reaction to *** , record what was eaten the amount eaten, preparation method, time from ingestion to reaction, and symptoms.   Call the clinic if this treatment plan is not working well for you  Follow up in *** or sooner if needed.

## 2023-05-09 ENCOUNTER — Other Ambulatory Visit: Payer: Self-pay

## 2023-05-09 ENCOUNTER — Encounter (HOSPITAL_BASED_OUTPATIENT_CLINIC_OR_DEPARTMENT_OTHER): Payer: Self-pay

## 2023-05-09 ENCOUNTER — Emergency Department (HOSPITAL_BASED_OUTPATIENT_CLINIC_OR_DEPARTMENT_OTHER)
Admission: EM | Admit: 2023-05-09 | Discharge: 2023-05-09 | Disposition: A | Discharge status: Home / Self Care | Payer: Medicaid Other | Attending: Emergency Medicine | Admitting: Emergency Medicine

## 2023-05-09 ENCOUNTER — Emergency Department (HOSPITAL_BASED_OUTPATIENT_CLINIC_OR_DEPARTMENT_OTHER): Payer: Medicaid Other

## 2023-05-09 DIAGNOSIS — J069 Acute upper respiratory infection, unspecified: Secondary | ICD-10-CM | POA: Diagnosis not present

## 2023-05-09 DIAGNOSIS — Z20822 Contact with and (suspected) exposure to covid-19: Secondary | ICD-10-CM | POA: Diagnosis not present

## 2023-05-09 DIAGNOSIS — Z7951 Long term (current) use of inhaled steroids: Secondary | ICD-10-CM | POA: Insufficient documentation

## 2023-05-09 DIAGNOSIS — J4541 Moderate persistent asthma with (acute) exacerbation: Secondary | ICD-10-CM | POA: Diagnosis not present

## 2023-05-09 DIAGNOSIS — Z91010 Allergy to peanuts: Secondary | ICD-10-CM | POA: Diagnosis not present

## 2023-05-09 DIAGNOSIS — R0602 Shortness of breath: Secondary | ICD-10-CM | POA: Diagnosis present

## 2023-05-09 LAB — RESP PANEL BY RT-PCR (RSV, FLU A&B, COVID)  RVPGX2
Influenza A by PCR: NEGATIVE
Influenza B by PCR: NEGATIVE
Resp Syncytial Virus by PCR: NEGATIVE
SARS Coronavirus 2 by RT PCR: NEGATIVE

## 2023-05-09 MED ORDER — BENZONATATE 100 MG PO CAPS: 100.0000 mg | ORAL_CAPSULE | Freq: Three times a day (TID) | ORAL | 0 refills | Status: DC

## 2023-05-09 MED ORDER — DEXAMETHASONE SODIUM PHOSPHATE 10 MG/ML IJ SOLN
10.0000 mg | Freq: Once | INTRAMUSCULAR | Status: AC
  Administered 2023-05-09: 10 mg via INTRAMUSCULAR
  Filled 2023-05-09: qty 1

## 2023-05-09 MED ORDER — ALBUTEROL SULFATE HFA 108 (90 BASE) MCG/ACT IN AERS: 2.0000 | INHALATION_SPRAY | Freq: Four times a day (QID) | RESPIRATORY_TRACT | 2 refills | Status: DC | PRN

## 2023-05-09 MED ORDER — ALBUTEROL SULFATE HFA 108 (90 BASE) MCG/ACT IN AERS: 2.0000 | INHALATION_SPRAY | RESPIRATORY_TRACT | Status: DC | PRN

## 2023-05-09 NOTE — ED Provider Notes (Signed)
[place] [facility] Provider Note   CSN: [id number] Arrival date & time: 05/09/23  1522     History  Chief Complaint  Patient presents with   Shortness of Breath   Fatigue    Sonya Castro is a 33 y.o. female.   Shortness of Breath  This patient is a 34 year old female, she has a history of asthma, she has been on Dupixent in the past but is not currently on it.  She has been taking multiple inhalers this morning because of wheezing coughing and shortness of breath, she has had fevers body aches headache sore throat as well as some nausea vomiting and diarrhea.  She reports that her child, young baby, has been sick recently with similar symptoms and is getting better, they are in daycare.    Home Medications Prior to Admission medications   Medication Sig Start Date End Date Taking? Authorizing Provider  benzonatate (TESSALON) 100 MG capsule Take 1 capsule (100 mg total) by mouth every 8 (eight) hours. 05/09/23  Yes Eber Hong, MD  albuterol (PROVENTIL) (2.5 MG/3ML) 0.083% nebulizer solution Take 3 mLs (2.5 mg total) by nebulization every 4 (four) hours as needed for wheezing or shortness of breath. 02/11/23 02/11/24  Verlee Monte, MD  albuterol (VENTOLIN HFA) 108 (90 Base) MCG/ACT inhaler Inhale 2 puffs into the lungs every 6 (six) hours as needed for wheezing or shortness of breath. 05/09/23   Eber Hong, MD  amoxicillin (AMOXIL) 400 MG/5ML suspension Do Not Take-Bring to your allergy appointment. 05/03/23   Verlee Monte, MD  azelastine (ASTELIN) 0.1 % nasal spray Place 2 sprays into both nostrils 2 (two) times daily as needed for rhinitis. 02/11/23   Verlee Monte, MD  Budeson-Glycopyrrol-Formoterol (BREZTRI AEROSPHERE) 160-9-4.8 MCG/ACT AERO Inhale 2 puffs into the lungs in the morning and at bedtime. 02/11/23   Verlee Monte, MD  budesonide-formoterol [facility]) 160-4.5 MCG/ACT inhaler Inhale 2 puffs into the lungs in the morning and at  bedtime. 02/13/23   Verlee Monte, MD  dupilumab (DUPIXENT) 300 MG/2ML prefilled syringe Inject 300 mg into the skin every 14 (fourteen) days. Patient not taking: Reported on 02/11/2023 08/03/22   Verlee Monte, MD  EPINEPHrine (EPIPEN 2-PAK) 0.3 mg/0.3 mL IJ SOAJ injection Inject 0.3 mg into the muscle as needed for anaphylaxis. 02/11/23   Verlee Monte, MD  fluconazole (DIFLUCAN) 150 MG tablet Take 1 tablet (150 mg total) by mouth once a week. Patient not taking: Reported on 02/11/2023 12/27/22   Wallis Bamberg, PA-C  fluticasone [facility]) 50 MCG/ACT nasal spray Place 2 sprays into both nostrils daily. 02/11/23   Verlee Monte, MD  ipratropium-albuterol (DUONEB) 0.5-2.5 (3) MG/3ML SOLN Take 3 mLs by nebulization every 6 (six) hours as needed. 02/11/23   Verlee Monte, MD  metroNIDAZOLE (FLAGYL) 500 MG tablet Take 1 tablet (500 mg total) by mouth 2 (two) times daily with a meal. DO NOT CONSUME ALCOHOL WHILE TAKING THIS MEDICATION. Patient not taking: Reported on 02/11/2023 12/27/22   Wallis Bamberg, PA-C  montelukast (SINGULAIR) 10 MG tablet Take 1 tablet (10 mg total) by mouth at bedtime. 02/11/23   Verlee Monte, MD  Nebulizers (COMPRESSOR/NEBULIZER) MISC Inhale 1 kit into the lungs as directed. 02/15/22   [provider]  ondansetron (ZOFRAN) 4 MG tablet Take 1 tablet (4 mg total) by mouth every 8 (eight) hours as needed for nausea or vomiting. Patient not taking: Reported on 02/11/2023 07/04/22   Laveda Norman,  Greta Doom, PA-C  Tiotropium Bromide Monohydrate (SPIRIVA RESPIMAT) 1.25 MCG/ACT AERS Inhale 2 puffs into the lungs daily. 02/13/23   Verlee Monte, MD      Allergies    Other, Peanut-containing drug products, Shellfish allergy, Amoxicillin, and Penicillins    Review of Systems   Review of Systems  Respiratory:  Positive for shortness of breath.   All other systems reviewed and are negative.   Physical Exam Updated Vital Signs BP 121/76   Pulse (!) 126   Temp 100.1 F (37.8 C)   Resp 20    Ht 1.499 m (4\' 11" )   Wt 76.7 kg   SpO2 100%   BMI 34.13 kg/m  Physical Exam Vitals and nursing note reviewed.  Constitutional:      General: She is not in acute distress.    Appearance: She is well-developed.  HENT:     Head: Normocephalic and atraumatic.     Mouth/Throat:     Pharynx: No oropharyngeal exudate.  Eyes:     General: No scleral icterus.       Right eye: No discharge.        Left eye: No discharge.     Conjunctiva/sclera: Conjunctivae normal.     Pupils: Pupils are equal, round, and reactive to light.  Neck:     Thyroid: No thyromegaly.     Vascular: No JVD.  Cardiovascular:     Rate and Rhythm: Regular rhythm. Tachycardia present.     Heart sounds: Normal heart sounds. No murmur heard.    No friction rub. No gallop.  Pulmonary:     Effort: Pulmonary effort is normal. No respiratory distress.     Breath sounds: Wheezing present. No rales.  Abdominal:     General: Bowel sounds are normal. There is no distension.     Palpations: Abdomen is soft. There is no mass.     Tenderness: There is no abdominal tenderness.  Musculoskeletal:        General: No tenderness. Normal range of motion.     Cervical back: Normal range of motion and neck supple.     Right lower leg: No edema.     Left lower leg: No edema.  Lymphadenopathy:     Cervical: No cervical adenopathy.  Skin:    General: Skin is warm and dry.     Findings: No erythema or rash.  Neurological:     Mental Status: She is alert.     Coordination: Coordination normal.  Psychiatric:        Behavior: Behavior normal.     ED Results / Procedures / Treatments   Labs (all labs ordered are listed, but only abnormal results are displayed) Labs Reviewed  RESP PANEL BY RT-PCR (RSV, FLU A&B, COVID)  [id number]    EKG EKG Interpretation Date/Time:  Thursday May 09 2023 15:50:31 EST Ventricular Rate:  126 PR Interval:  139 QRS Duration:  79 QT Interval:  294 QTC Calculation: 426 R Axis:   63  Text  Interpretation: Sinus tachycardia Consider right atrial enlargement Confirmed by Eber Hong ([id number]) on 05/09/2023 6:27:46 PM  Radiology DG Chest 2 View Result Date: 05/09/2023 CLINICAL DATA:  Shortness of breath. EXAM: CHEST - 2 VIEW COMPARISON:  December 31, 2021. FINDINGS: The heart size and mediastinal contours are within normal limits. Both lungs are clear. The visualized skeletal structures are unremarkable. IMPRESSION: No active cardiopulmonary disease. Electronically Signed   By: Lupita Raider M.D.   On: 05/09/2023 17:18  Procedures Procedures    Medications Ordered in ED Medications  albuterol (VENTOLIN HFA) 108 (90 Base) MCG/ACT inhaler 2 puff (has no administration in time range)  dexamethasone (DECADRON) injection 10 mg (has no administration in time range)    ED Course/ Medical Decision Making/ A&P                                 Medical Decision Making Amount and/or Complexity of Data Reviewed Radiology: ordered.  Risk Prescription drug management.    This patient presents to the ED for concern of wheezing shortness of breath headache body aches and fever, this involves an extensive number of treatment options, and is a complaint that carries with it a high risk of complications and morbidity.  The differential diagnosis includes viral illness, pneumonia, COVID, flu most likely   Co morbidities that complicate the patient evaluation  Asthma, obesity   Additional history obtained:  Additional history obtained from medical record External records from outside source obtained and reviewed including office visits, Dupixent administration in the past, nothing recently Evidently the patient has also been on amoxicillin for the last 6 days and states it has not helped very much   Lab Tests:  I Ordered, and personally interpreted labs.  The pertinent results include: Negative for COVID and flu, negative for RSV   Imaging Studies ordered:  I ordered  imaging studies including 2 view chest x-ray PA and lateral I independently visualized and interpreted imaging which showed no signs of pneumonia or pneumothorax I agree with the radiologist interpretation   Cardiac Monitoring: / EKG:  The patient was maintained on a cardiac monitor.  I personally viewed and interpreted the cardiac monitored which showed an underlying rhythm of: Sinus tachycardia gradually improving   Problem List / ED Course / Critical interventions / Medication management  Patient is well-appearing suspect viral syndrome, clear chest x-ray, no hypoxia, minimal wheezing and a known asthmatic with viral illness and the child with similar symptoms.  Although her vital signs are consistent with having a systemic inflammatory response syndrome I think this is more likely to be viral and less bacterial.  She is speaking in full sentences, extremely well-appearing, she has diffuse bodyaches and a headache with coughing and a sore throat all consistent with a viral process.  No signs of pneumonia I ordered medication including dexamethasone for asthma flareup Reevaluation of the patient after these medicines showed that the patient improved I have reviewed the patients home medicines and have made adjustments as needed   Social Determinants of Health:  Chronic asthma   Test / Admission - Considered:  Considered admission but the patient is well-appearing and not hypoxic, likely viral, stable for discharge, patient agreeable         Final Clinical Impression(s) / ED Diagnoses Final diagnoses:  Moderate persistent asthma with exacerbation  Viral upper respiratory illness    Rx / DC Orders ED Discharge Orders          Ordered    benzonatate (TESSALON) 100 MG capsule  Every 8 hours        05/09/23 1836    albuterol (VENTOLIN HFA) 108 (90 Base) MCG/ACT inhaler  Every 6 hours PRN        05/09/23 1836              Eber Hong, MD 05/09/23 [id number]

## 2023-05-09 NOTE — Discharge Instructions (Signed)
Your testing was negative for the flu, for COVID and for pneumonia.  I suspect this is a virus that is causing you to have an asthma flareup.  We gave you a steroid shot, I refilled your albuterol and gave you a prescription cough medication.  Thank you for allowing [place] to treat you in the emergency department today.  After reviewing your examination and potential testing that was done it appears that you are safe to go home.  I would like for you to follow-up with your doctor within the next several days, have them obtain your records and follow-up with them to review all potential tests and results from your visit.  If you should develop severe or worsening symptoms return to the emergency department immediately

## 2023-05-29 ENCOUNTER — Inpatient Hospital Stay (HOSPITAL_COMMUNITY)
Admission: AD | Admit: 2023-05-29 | Discharge: 2023-05-29 | Disposition: A | Discharge status: Home / Self Care | Payer: Medicaid Other | Attending: Obstetrics and Gynecology | Admitting: Obstetrics and Gynecology

## 2023-05-29 ENCOUNTER — Inpatient Hospital Stay (HOSPITAL_COMMUNITY): Payer: Medicaid Other

## 2023-05-29 ENCOUNTER — Encounter (HOSPITAL_COMMUNITY): Payer: Self-pay | Admitting: Obstetrics and Gynecology

## 2023-05-29 DIAGNOSIS — Z3A01 Less than 8 weeks gestation of pregnancy: Secondary | ICD-10-CM | POA: Insufficient documentation

## 2023-05-29 DIAGNOSIS — R1030 Lower abdominal pain, unspecified: Secondary | ICD-10-CM | POA: Insufficient documentation

## 2023-05-29 DIAGNOSIS — Z3491 Encounter for supervision of normal pregnancy, unspecified, first trimester: Secondary | ICD-10-CM

## 2023-05-29 DIAGNOSIS — O26851 Spotting complicating pregnancy, first trimester: Secondary | ICD-10-CM | POA: Diagnosis not present

## 2023-05-29 DIAGNOSIS — O26891 Other specified pregnancy related conditions, first trimester: Secondary | ICD-10-CM | POA: Diagnosis present

## 2023-05-29 DIAGNOSIS — O26859 Spotting complicating pregnancy, unspecified trimester: Secondary | ICD-10-CM

## 2023-05-29 HISTORY — DX: Cardiac murmur, unspecified: R01.1

## 2023-05-29 HISTORY — DX: Unspecified asthma, uncomplicated: J45.909

## 2023-05-29 HISTORY — DX: Urinary tract infection, site not specified: N39.0

## 2023-05-29 LAB — CBC
HCT: 36.2 % (ref 36.0–46.0)
Hemoglobin: 11.9 g/dL — ABNORMAL LOW (ref 12.0–15.0)
MCH: 27.2 pg (ref 26.0–34.0)
MCHC: 32.9 g/dL (ref 30.0–36.0)
MCV: 82.6 fL (ref 80.0–100.0)
Platelets: 492 10*3/uL — ABNORMAL HIGH (ref 150–400)
RBC: 4.38 MIL/uL (ref 3.87–5.11)
RDW: 12.6 % (ref 11.5–15.5)
WBC: 11.4 10*3/uL — ABNORMAL HIGH (ref 4.0–10.5)
nRBC: 0 % (ref 0.0–0.2)

## 2023-05-29 LAB — ABO/RH: ABO/RH(D): O POS

## 2023-05-29 LAB — HCG, QUANTITATIVE, PREGNANCY: hCG, Beta Chain, Quant, S: 15146 m[IU]/mL — ABNORMAL HIGH (ref <5)

## 2023-05-29 NOTE — MAU Note (Signed)
Sonya Castro is a 33 y.o. at [redacted]w[redacted]d here in MAU reporting: preg confirmed.  Hx of ectopic preg Dec of 2022.  Was having pain last night and some today.  Had some spotting yesterday, none since.  Dr sent to make sure sure everything is ok.  LMP: 1/11 Onset of complaint: 2/18 Pain score: mild Vitals:   05/29/23 1250  BP: 113/68  Pulse: 82  Resp: 18  Temp: 98.4 F (36.9 C)  SpO2: 98%      Lab orders placed from triage:

## 2023-05-29 NOTE — Discharge Instructions (Signed)
We highly recommend childbirth education to help you plan for labor and begin practicing coping skills (which will be needed with or without pain meds).  [place] Childbirth Education Options: Sign up by visiting [email or web address]  Childbirth ~ Self-Paced eClass (English and Spanish) This online class offers you the freedom to complete a childbirth education series in the comfort of your own home at your own pace.  Childbirth Class (In-Person 4-Week Series  or on Saturdays, Virtual 4-Week Series ~ [place]) This interactive in-person class series will help you and your partner prepare for your birth experience. Topics include: Labor & Birth, Comfort Measures, Breathing Techniques, Massage, Medical Interventions, Pain Management Options, Cesarean Birth, Postpartum Care, and Newborn Care  Comfort Techniques for Labor ~ In-Person Class ([place]) This interactive class is designed for parents-to-be who want to learn & practice hands-on skills to help relieve some of the discomfort of labor and encourage their babies to rotate toward the best position for birth. Moms and their partners will be able to try a variety of labor positions with birth balls and rebozos as well as practice breathing, relaxation, and visualization techniques.  Natural Childbirth Class (In-Person 5-Week Series, In-Person on Saturdays or Virtual 5-Week Series ~ [place]) This class series is designed for expectant parents who want to learn and practice natural methods of coping with the process of labor and childbirth.  Cesarean Birth Self-Paced eClass (English and Spanish) This online course provides comprehensive information you can trust as you prepare for a possible cesarean birth. In this class, you'll learn how to make your birth and recovery comfortable and joyful through instructive video clips, animations, and activities.  Waterbirth ~ Virtual Class Interested in a waterbirth? In addition to a consultation  with your credentialed waterbirth provider, this free, informational online class will help you discover whether waterbirth is the right fit for you. Not all obstetrical practices offer waterbirth, so check with your healthcare provider.  Tour (Self-Paced Video) - [facility] Watch our 4 minute video tour of [facility] located in [place].   [place] Parenting Education Options:  Pregnancy 101 (Virtual) Congratulations on your pregnancy! This class is geared toward moms in their first trimester, but everyone is welcome. We are excited to guide you through all aspects of supporting a healthy pregnancy. You will learn what to expect at routine prenatal care appointments, common postpartum adjustments, basic infant safety, and breastfeeding.  Successful Partnering & Parenting ~ In-Person Workshop ([place]) This workshop inspires and equips partners of all economic levels, ages, and cultures to confidently care for their infants, support the birthing persons, and navigate their own transformations into new partners and parents. Learning activities are geared towards supporting partner, but moms are welcome to attend.  'Baby & Me' Parenting Group (Virtual on Wednesdays at 11am) Enjoy this time discussing newborn & infant parenting topics and family adjustment issues with other new parents in a relaxed environment. Each week brings a new speaker or baby-centered activity. This group offers support and connection to parents as they journey through the adjustments and struggles of that sometimes overwhelming first year after the birth of a child.  Baby Safety, CPR, & Choking Class ~ Virtual This life-saving information is meant to encourage parents as they learn important safety and prevention tips as well as infant CPR and relief of choking.  Breastfeeding Class (In-Person in [place] or Virtual) Families learn what to expect in the  first days and weeks of breastfeeding your   newborn. IF YOU ARE AN EMPLOYEE TAKING THIS CLASS FOR CREDIT, DO NOT register yourself. Please e-mail [email or web address].   Breastfeeding Self-Paced eClass (English & Spanish) Families learn what to expect in the first days and weeks of breastfeeding your newborn.  Caring for Baby ~ In-Person, Virtual or Self-Paced Class This in-person class is for both expectant and adoptive parents who want to learn and practice the most up-to-date newborn care for their babies. Focus is on birth through the first six weeks of life.  CPR & Choking Relief for Infants & Children ~ In-Person Class ([place]) This in-person course is designed for any parent, expectant parent, or adult who cares for infants or children. Participants learn and demonstrate cardiopulmonary resuscitation and choking relief procedures for both infants and children.  Grandparent Love ~ In-Person Class Grandparents will learn the most updated infant care and safety recommendations. They will discover ways to support their own children during the transition into the parenting role and receive tips on communicating with the new parents.  [place] Parenting Support Group Options:  Bereavement Grief Support Group (Pregnancy/Infant Loss) - Virtual This is an ongoing experience that meets once a month and is designed to help you honor the past, assist you in discovering tools to strengthen you today, and aid you in developing hope for the future.  Breastfeeding & Pumping Support Group (In-Person on Thursdays at 12pm or Virtual on Tuesdays at 5pm) Join us in-person each Thursday starting June 1st, 2023 at 12pm! This support group is free for all families looking for breastfeeding and/or pumping support.   Community-Based Childbirth Education Options:  [place] [facility] Classes:  Childbirth education classes can help you get ready for a positive parenting experience. You  can also meet other expectant parents and get free stuff for your baby. Each class runs for five weeks on the same night and costs $45 for the mother-to-be and her support person. Medicaid covers the cost if you are eligible. Call [phone] to register.  [facility] [facility] [employer] offers a variety of programs for the [place] community and is another great way to get connected. Please go to [email or web address] for more information.  Childbirth With A Twist! Be informed of your options, get educated on birth, understand what your body is doing, learn how to cope, and have a lot of fun and laughs all while doing it either from the comfort of your couch OR in our cozy office and classroom space near the [place] airport. If you are taking a virtual class, then class is taught LIVE, so you can ask questions and receive answers in real-time from an experienced doula and childbirth educator.  This virtual childbirth education class will meet for five instruction times online.  Although we are based in [place], [place], this virtual class is open to anyone in the world. Please visit: [email or web address] for more information.  Books We Love: The Doula Guide to Childbirth by Ananda Lowe and Rachel Zimmerman The First-Time Parent's Childbirth Handbook by Dr. Stephanie Mitchell, CNM The Birth Partner by Penny Simkin ---------------------------------------------------------------------------------------------------------------------------------------------------------------------------------                  Safe Medications in Pregnancy    Acne: Benzoyl Peroxide Salicylic Acid  Backache/Headache: Tylenol: 2 regular strength every 4 hours OR              2 Extra strength every 6 hours  Colds/Coughs/Allergies: Benadryl (alcohol free) 25 mg every 6 hours as needed Breath right strips Claritin Cepacol throat   lozenges Chloraseptic throat spray Cold-Eeze- up to  three times per day Cough drops, alcohol free Flonase (by prescription only) Guaifenesin Mucinex Robitussin DM (plain only, alcohol free) Saline nasal spray/drops Sudafed (pseudoephedrine) & Actifed ** use only after [redacted] weeks gestation and if you do not have high blood pressure Tylenol Vicks Vaporub Zinc lozenges Zyrtec   Constipation: Colace Ducolax suppositories Fleet enema Glycerin suppositories Metamucil Milk of magnesia Miralax Senokot Smooth move tea  Diarrhea: Kaopectate Imodium A-D  *NO pepto Bismol  Hemorrhoids: Anusol Anusol HC Preparation H Tucks  Indigestion: Tums Maalox Mylanta Zantac  Pepcid  Insomnia: Benadryl (alcohol free) 25mg every 6 hours as needed Tylenol PM Unisom, no Gelcaps  Leg Cramps: Tums MagGel  Nausea/Vomiting:  Bonine Dramamine Emetrol Ginger extract Sea bands Meclizine  Nausea medication to take during pregnancy:  Unisom (doxylamine succinate 25 mg tablets) Take one tablet daily at bedtime. If symptoms are not adequately controlled, the dose can be increased to a maximum recommended dose of two tablets daily (1/2 tablet in the morning, 1/2 tablet mid-afternoon and one at bedtime). Vitamin B6 100mg tablets. Take one tablet twice a day (up to 200 mg per day).  Skin Rashes: Aveeno products Benadryl cream or 25mg every 6 hours as needed Calamine Lotion 1% cortisone cream  Yeast infection: Gyne-lotrimin 7 Monistat 7   **If taking multiple medications, please check labels to avoid duplicating the same active ingredients **take medication as directed on the label ** Do not exceed 4000 mg of tylenol in 24 hours **Do not take medications that contain aspirin or ibuprofen    

## 2023-05-29 NOTE — MAU Provider Note (Signed)
Event Date/Time   First Provider Initiated Contact with Patient 05/29/23 1302      S Ms. Sonya Castro is a 33 y.o. [phone] patient who presents to MAU today with complaint of a confirmed pregnancy with a h/o ectopic having some mild pain last night and today. Mild spotting reported . Denies active VB, LOF, .   O BP 113/68 (BP Location: Right Arm)   Pulse 82   Temp 98.4 F (36.9 C) (Oral)   Resp 18   Ht 4\' 11"  (1.499 m)   Wt 83.5 kg   LMP 04/20/2023   SpO2 98%   BMI 37.18 kg/m  Physical Exam Vitals and nursing note reviewed. Exam conducted with a chaperone present.  Constitutional:      General: She is not in acute distress.    Appearance: She is well-developed. She is not ill-appearing.  HENT:     Head: Normocephalic.  Cardiovascular:     Rate and Rhythm: Normal rate and regular rhythm.  Pulmonary:     Effort: Pulmonary effort is normal.     Breath sounds: Normal breath sounds.  Abdominal:     General: There is no distension.     Palpations: Abdomen is soft.  Neurological:     Mental Status: She is alert and oriented to person, place, and time.  Psychiatric:        Mood and Affect: Mood is anxious.        Behavior: Behavior normal.      Lab Orders         CBC         hCG, quantitative, pregnancy       Results for orders placed or performed during the hospital encounter of 05/29/23 (from the past 24 hours)  CBC     Status: Abnormal   Collection Time: 05/29/23  1:43 PM  Result Value Ref Range   WBC 11.4 (H) 4.0 - 10.5 K/uL   RBC 4.38 3.87 - 5.11 MIL/uL   Hemoglobin 11.9 (L) 12.0 - 15.0 g/dL   HCT 95.2 84.1 - 32.4 %   MCV 82.6 80.0 - 100.0 fL   MCH 27.2 26.0 - 34.0 pg   MCHC 32.9 30.0 - 36.0 g/dL   RDW 40.1 02.7 - 25.3 %   Platelets 492 (H) 150 - 400 K/uL   nRBC 0.0 0.0 - 0.2 %  ABO/Rh     Status: None   Collection Time: 05/29/23  1:43 PM  Result Value Ref Range   ABO/RH(D) O POS    No rh immune globuloin      NOT A RH IMMUNE GLOBULIN CANDIDATE, PT RH  POSITIVE Performed at [facility], [address], [place], [place] [address]   hCG, quantitative, pregnancy     Status: Abnormal   Collection Time: 05/29/23  1:43 PM  Result Value Ref Range   hCG, Beta Chain, Quant, S 15,146 (H) <5 mIU/mL     ASSESSMENT Medical screening exam complete IUP confirmed    MDM  Confirmed  IUP  appropriate for GA CBC/ABO/HCG O POSITIVE    I have reviewed the patient chart and performed the physical exam . I have ordered & interpreted the lab results and reviewed and interpreted the images Medications ordered as stated below.  A/P as described below.  Counseling and education provided and patient agreeable  with plan as described below. Verbalized understanding.     HIGH  1. Normal intrauterine pregnancy on prenatal ultrasound in first trimester (Primary)  2. Lower abdominal pain  3. [redacted] weeks gestation of pregnancy  4. Spotting in early pregnancy   PLAN Discharge from MAU in stable condition F/U as scheduled  ( Please see AVS for written and verbal instructions given) Patient given the option of transfer to [facility] for further evaluation or seek care in outpatient facility of choice  List of options for follow-up given  Warning signs for worsening condition that would warrant emergency follow-up discussed Patient may return to MAU as needed   Colman Cater, NP 05/29/2023 2:56 PM

## 2023-06-10 ENCOUNTER — Ambulatory Visit (INDEPENDENT_AMBULATORY_CARE_PROVIDER_SITE_OTHER): Payer: Medicaid Other | Admitting: Internal Medicine

## 2023-06-10 ENCOUNTER — Encounter: Payer: Self-pay | Admitting: Internal Medicine

## 2023-06-10 ENCOUNTER — Other Ambulatory Visit: Payer: Self-pay

## 2023-06-10 VITALS — BP 100/64 | HR 100 | Temp 98.2°F | Wt 184.4 lb

## 2023-06-10 DIAGNOSIS — H1013 Acute atopic conjunctivitis, bilateral: Secondary | ICD-10-CM

## 2023-06-10 DIAGNOSIS — J3089 Other allergic rhinitis: Secondary | ICD-10-CM | POA: Diagnosis not present

## 2023-06-10 DIAGNOSIS — T781XXD Other adverse food reactions, not elsewhere classified, subsequent encounter: Secondary | ICD-10-CM

## 2023-06-10 DIAGNOSIS — J302 Other seasonal allergic rhinitis: Secondary | ICD-10-CM | POA: Diagnosis not present

## 2023-06-10 DIAGNOSIS — R21 Rash and other nonspecific skin eruption: Secondary | ICD-10-CM

## 2023-06-10 DIAGNOSIS — J4550 Severe persistent asthma, uncomplicated: Secondary | ICD-10-CM | POA: Diagnosis not present

## 2023-06-10 DIAGNOSIS — Z880 Allergy status to penicillin: Secondary | ICD-10-CM

## 2023-06-10 MED ORDER — ALBUTEROL SULFATE HFA 108 (90 BASE) MCG/ACT IN AERS: 2.0000 | INHALATION_SPRAY | Freq: Four times a day (QID) | RESPIRATORY_TRACT | 2 refills | Status: DC | PRN

## 2023-06-10 MED ORDER — OLOPATADINE HCL 0.2 % OP SOLN: 1.0000 [drp] | Freq: Every day | OPHTHALMIC | 5 refills | Status: DC | PRN

## 2023-06-10 MED ORDER — CETIRIZINE HCL 10 MG PO TABS: 10.0000 mg | ORAL_TABLET | Freq: Every day | ORAL | 5 refills | Status: AC | PRN

## 2023-06-10 MED ORDER — MONTELUKAST SODIUM 10 MG PO TABS: 10.0000 mg | ORAL_TABLET | Freq: Every day | ORAL | 1 refills | Status: DC

## 2023-06-10 MED ORDER — BUDESONIDE-FORMOTEROL FUMARATE 160-4.5 MCG/ACT IN AERO: 2.0000 | INHALATION_SPRAY | Freq: Two times a day (BID) | RESPIRATORY_TRACT | 5 refills | Status: DC

## 2023-06-10 MED ORDER — TRIAMCINOLONE ACETONIDE 0.1 % EX OINT: TOPICAL_OINTMENT | CUTANEOUS | 1 refills | Status: AC

## 2023-06-10 NOTE — Patient Instructions (Addendum)
 Severe Persistent Asthma: not controlled since having the flu, change in insurance and off inahlers. - Controller Inhaler: Start Symbicort 160 mcg 2 puffs twice a day; This Should Be Used Everyday - Rinse mouth out after use - use with a spacer -Singulair (montelukast) continue 10 mg daily - stop if nightmares of mood changes. - Rescue Inhaler:  albuterol  2-4 puffs every 4 hours as needed or  1 vial albuterol Use  every 4-6 hours as needed for chest tightness, wheezing, or coughing.  Can also use 15 minutes prior to exercise if you have symptoms with activity. Or 1 vial duonebs - every 6 to 8 hours as needed - Asthma is not controlled if:  - Symptoms are occurring >2 times a week OR  - >2 times a month nighttime awakenings  - You are requiring systemic steroids (prednisone/steroid injections) more than once per year  - Your require hospitalization for your asthma.  - Please call the clinic to schedule a follow up if these symptoms arise -restart dupixent injections-call Tammy VonCannon. Discussed risk with pregnancies. Advised joining pregnancy monitoring system through dupixent. [phone]  Seasonal and Perennial Allergic Rhinitis with anosmia without nasal polyps - 2024 allergy testing positive to grass pollen, weed pollen, tree pollen, outdoor molds, cat, dog and horse; cockroach, mold mix 2 (indoor molds), dust mites and ragweed mix - Prevention:  - allergen avoidance when possible - consider allergy shots as long term control of your symptoms by teaching your immune system to be more tolerant of your allergy triggers  - asthma must first be controlled and after giving birth. - Symptom control: First use saline rinses twice daily then use flonase and astelin. - Continue Singulair (Montelukast) 10mg  nightly.   - Discontinue if nightmares of behavior changes. - Continue Antihistamine: Zyrtec (Cetirizine) 10 mg daily as needed  Allergic Conjunctivitis:  - Consider  Pataday  (Olopatadine) for eye symptoms daily as needed  Food allergy:  - please strictly avoid shellfish and peanuts and tree nuts - for SKIN only reaction, okay to take Benadryl 2 capsules every 4 hours - for SKIN + ANY additional symptoms, OR IF concern for LIFE THREATENING reaction = Epipen Autoinjector EpiPen 0.3 mg. - If using Epinephrine autoinjector, call 911 - A food allergy action plan has been provided and discussed. - Medic Alert identification is recommended.  Oral Allergy Syndrome (watermelon, pineapple): Avoid all raw fruits and vegetables that bother you. Allergy injections may improve these symptoms. - Heating these foods, buying them canned, and peeling these foods should allow them to be consumed without symptoms or with less symptoms.  H/O Penicillin allergy: Asthma must first be controlled. - please schedule follow-up appt at your convenience for penicillin testing followed by graded oral challenge if indicated-would wait until second trimester and would do skin and intradermal testing first.  Rash-bumpy and itchy on back of neck and upper arms - triamcinolone 0.01% twice daily as needed. Do not use on face, groin or armpits. -use dye free and scent free products for now -consider patch testing if continues to occur.   Follow up :2-3 months, sooner if needed.  Thank you for allowing me to participate in your care.  Tonny Bollman, MD [facility]  Reducing Pollen Exposure  The American Academy of Allergy, Asthma and Immunology suggests the following steps to reduce your exposure to pollen during allergy seasons.    Do not hang sheets or clothing out to dry; pollen may collect on these  items. Do not mow lawns or spend time around freshly cut grass; mowing stirs up pollen. Keep windows closed at night.  Keep car windows closed while driving. Minimize morning activities outdoors, a time when pollen counts are usually at their highest. Stay indoors as much  as possible when pollen counts or humidity is high and on windy days when pollen tends to remain in the air longer. Use air conditioning when possible.  Many air conditioners have filters that trap the pollen spores. Use a HEPA room air filter to remove pollen form the indoor air you breathe. Control of Dog or Cat Allergen  Avoidance is the best way to manage a dog or cat allergy. If you have a dog or cat and are allergic to dog or cats, consider removing the dog or cat from the home. If you have a dog or cat but don't want to find it a new home, or if your family wants a pet even though someone in the household is allergic, here are some strategies that may help keep symptoms at bay:  Keep the pet out of your bedroom and restrict it to only a few rooms. Be advised that keeping the dog or cat in only one room will not limit the allergens to that room. Don't pet, hug or kiss the dog or cat; if you do, wash your hands with soap and water. High-efficiency particulate air (HEPA) cleaners run continuously in a bedroom or living room can reduce allergen levels over time. Regular use of a high-efficiency vacuum cleaner or a central vacuum can reduce allergen levels. Giving your dog or cat a bath at least once a week can reduce airborne allergen. Control of Mold Allergen   Mold and fungi can grow on a variety of surfaces provided certain temperature and moisture conditions exist.  Outdoor molds grow on plants, decaying vegetation and soil.  The major outdoor mold, Alternaria and Cladosporium, are found in very high numbers during hot and dry conditions.  Generally, a late Summer - Fall peak is seen for common outdoor fungal spores.  Rain will temporarily lower outdoor mold spore count, but counts rise rapidly when the rainy period ends.  The most important indoor molds are Aspergillus and Penicillium.  Dark, humid and poorly ventilated basements are ideal sites for mold growth.  The next most common sites of  mold growth are the bathroom and the kitchen.  Outdoor (Seasonal) Mold Control  Use air conditioning and keep windows closed Avoid exposure to decaying vegetation. Avoid leaf raking. Avoid grain handling. Consider wearing a face mask if working in moldy areas.    Indoor (Perennial) Mold Control   Maintain humidity below 50%. Clean washable surfaces with 5% bleach solution. Remove sources e.g. contaminated carpets. DUST MITE AVOIDANCE MEASURES:  There are three main measures that need and can be taken to avoid house dust mites:  Reduce accumulation of dust in general -reduce furniture, clothing, carpeting, books, stuffed animals, especially in bedroom  Separate yourself from the dust -use pillow and mattress encasements (can be found at stores such as [employer] or online) -avoid direct exposure to air condition flow -use a HEPA filter device, especially in the bedroom; you can also use a HEPA filter vacuum cleaner -wipe dust with a moist towel instead of a dry towel or broom when cleaning  Decrease mites and/or their secretions -wash clothing and linen and stuffed animals at highest temperature possible, at least every 2 weeks -stuffed animals can also  be placed in a bag and put in a freezer overnight  Despite the above measures, it is impossible to eliminate dust mites or their allergen completely from your home.  With the above measures the burden of mites in your home can be diminished, with the goal of minimizing your allergic symptoms.  Success will be reached only when implementing and using all means together.

## 2023-06-10 NOTE — Progress Notes (Signed)
 FOLLOW UP Date of Service/Encounter:  06/10/23  Subjective:  Sonya Castro (DOB: 06/24/1990) is a 33 y.o. female who [facility] in re-evaluation of the following:  asthma, food allergies, allergic rhinitis, penicillin allergy  History obtained from: chart review and patient.  For Review, LV was on 02/11/23  with Dr.Esau Fridman seen for routine follow-up. See below for summary of history and diagnostics.   Therapeutic plans/changes recommended: Her FEV1 was 95%, but she was off her Markus Daft and Dupixent due to lapse in insurance wanting to restart.  Dupixent and Breztri were both restarted at that visit. ----------------------------------------------------- Pertinent History/Diagnostics:  Asthma: Diagnosed as an adult, age 89 yo. Using rescue inhaler multiple times per day at work.  Triggers: fumes, scents, dog-has dog in the home. Roaches. exercise  AEC 600 on 04/21/21. She received prednisone at least 6 times in the past year.  Seen in ED 4 times for asthma/bronchitis in 2023. -  spirometry (05/16/22): ratio 0.67, 94% FEV1 (pre), 87% FEV1 (post)-mild obstruction without postbronchodilator response - labs: 05/16/22: IgE 2410, AEC 400 -06/20/22: planned to start dupixent, continue Breztri 160 mcg 2 puffs BID, singulair and Airsupra PRN. Dupi approved in March, but never started. Allergic Rhinitis:  Ongoing nasal congestion, hyposmia. Right nostril is harder for her breath.  She can't taste and smell.  She has been having trouble with this for 3.5 months.  No ENT surgeries.  - SPT environmental panel (05/16/22):  skin testing positive to grass pollen, weed pollen, tree pollen, outdoor molds, cat, dog and horse Intradermal testing was positive to cockroach, mold mix 2 (indoor molds), dust - 06/20/22-rehomed her dog and put in air purifier and reported improved symptoms Plan: singulair, OTC AH, consider AIT once asthma controlled, pataday PRN Pollen food  allergy syndrome: Pineapple, watermelon-makes her itchy.  She continues to eat these fruits despite these symptoms. Only happens when raw and fresh. Food Allergies-nuts and shellfish Shellfish: she gets itchy all over, including her throat. She has avoided for over one year. Does not carry an epipen.  Peanuts: tongue swelling and mouth itching immediately after eating a snickers bar - SPT negative to shellfish 05/16/22. - labs 05/16/22: positive to shrimp 4.62, crab 0.31, clam 0.26, scallop 0.30, oyster 0.24, lobster 0.16 - labs 06/05/22: peanut 8.89 (ara h2 0.13, ara h8 13.40, arah9 2.53), hazelnut 18, walnut 1.56, cashew 0.37, [place] nut 0.24, macadamia 1.69, pecan 0.42, pistachio 1.48, almond 3.94 Penicillin allergy:  Hives and sensation of throat closing. Last given in childhood.  Symptoms lasted around 2.5 days.  Testing and challenge offered --------------------------------------------------- Today presents for follow-up. Discussed the use of AI scribe software for clinical note transcription with the patient, who gave verbal consent to proceed.  History of Present Illness   Sonya Castro is a 33 year old female who presents with shortness of breath and asthma management during pregnancy.  She is currently eight weeks pregnant and experiencing worsening shortness of breath since the onset of her pregnancy. The shortness of breath is particularly noticeable during physical activities such as walking or using the treadmill, leading to increased use of her albuterol inhaler during these activities.  She has a history of asthma and has been using Dupixent, but has not received it since her last visit due to issues with insurance and pharmacy coordination. She has previously tried Symbicort, which provided some relief, but her current insurance does not cover [employer].  In January and February, she and her child were  both ill with a viral infection that progressed to a respiratory infection and  was eventually diagnosed as the flu. This illness coincided with her discovery of the pregnancy, as she initially attributed her symptoms to the flu. She has a history of a blocked right fallopian tube and was not expecting to become pregnant. She was unable to attend penicillin testing due to the flu and plans to wait until her second trimester for this. She did not have Group B Streptococcus (GBS) in her previous pregnancy.  She reports some rashes on her arms and the back of her neck, which started recently and are mildly itchy. There have been no changes in detergents or soaps. She has been using triamcinolone, which her son has, to manage the rash. She has no prior history of eczema.  She has not been working due to difficulty finding a job and has been on [employer] for healthcare coverage.       Chart Review: ED visit 05/09/2023 for asthma exacerbation-normal chest x-ray.  Treated with dexamethasone.  Wheezing noted on exam. ED visit 05/13/2023 with dizziness in shortness of breath-son diagnosed with flu A.  Chest x-ray again normal.  EKG reassuring. 05/29/23-5 weeks and 3 days pregnant  All medications reviewed by clinical staff and updated in chart. No new pertinent medical or surgical history except as noted in HPI.  ROS: All others negative except as noted per HPI.   Objective:  BP 100/64 (BP Location: Left Arm, Patient Position: Sitting, Cuff Size: Normal)   Pulse 100   Temp 98.2 F (36.8 C) (Temporal)   Wt 184 lb 6.4 oz (83.6 kg)   LMP 04/20/2023   SpO2 100%   BMI 37.24 kg/m  Body mass index is 37.24 kg/m. Physical Exam: General Appearance:  Alert, cooperative, no distress, appears stated age  Head:  Normocephalic, without obvious abnormality, atraumatic  Eyes:  Conjunctiva clear, EOM's intact  Ears EACs normal bilaterally and normal TMs bilaterally  Nose: Nares normal, hypertrophic turbinates, normal mucosa, and no visible anterior polyps  Throat: Lips, tongue normal;  teeth and gums normal, normal posterior oropharynx  Neck: Supple, symmetrical  Lungs:   clear to auscultation bilaterally, Respirations unlabored, no coughing  Heart:  regular rate and rhythm and no murmur, Appears well perfused  Extremities: No edema  Skin: Skin color, texture, turgor normal and no rashes or lesions on visualized portions of skin  Neurologic: No gross deficits   Labs:  Lab Orders  No laboratory test(s) ordered today    Spirometry:  Tracings reviewed. Her effort: It was hard to get consistent efforts and there is a question as to whether this reflects a maximal maneuver.  Poor repeatability FVC: 2.98L FEV1: 2.22L, 92% predicted FEV1/FVC ratio: 0.74 Interpretation:  Borderline mild obstruction Please see scanned spirometry results for details.  Assessment/Plan  Asthma poorly controlled.  Now 8 weeks present.  She has been on Dupixent in the past and tolerated, but has had a gap in therapy due to insurance changes.  We discussed risks and benefits of restarting Dupixent during pregnancy. We discussed restarting without the loading dose. She would like to pursue given her ongoing SOB. She is also out of her inhalers.  Severe Persistent Asthma: not controlled since having the flu, change in insurance and off inahlers. - Controller Inhaler: Start Symbicort 160 mcg 2 puffs twice a day; This Should Be Used Everyday - Rinse mouth out after use - use with a spacer -Singulair (montelukast) continue 10 mg daily -  stop if nightmares of mood changes. - Rescue Inhaler: albuterol 2-4 puffs every 4 hours as needed or  1 vial albuterol Use  every 4-6 hours as needed for chest tightness, wheezing, or coughing.  Can also use 15 minutes prior to exercise if you have symptoms with activity. Or 1 vial duonebs - every 6 to 8 hours as needed - Asthma is not controlled if:  - Symptoms are occurring >2 times a week OR  - >2 times a month nighttime awakenings  - You are requiring systemic  steroids (prednisone/steroid injections) more than once per year  - Your require hospitalization for your asthma.  - Please call the clinic to schedule a follow up if these symptoms arise -restart dupixent injections-call Tammy VonCannon. Discussed risk with pregnancies. Advised joining pregnancy monitoring system through dupixent. [phone]  Seasonal and Perennial Allergic Rhinitis with anosmia without nasal polyps-at goal - 2024 allergy testing positive to grass pollen, weed pollen, tree pollen, outdoor molds, cat, dog and horse; cockroach, mold mix 2 (indoor molds), dust mites and ragweed mix - Prevention:  - allergen avoidance when possible - consider allergy shots as long term control of your symptoms by teaching your immune system to be more tolerant of your allergy triggers  - asthma must first be controlled and after giving birth. - Symptom control: First use saline rinses twice daily then use flonase and astelin. - Continue Singulair (Montelukast) 10mg  nightly.   - Discontinue if nightmares of behavior changes. - Continue Antihistamine: Zyrtec (Cetirizine) 10 mg daily as needed  Allergic Conjunctivitis: at goal - Consider Pataday (Olopatadine) for eye symptoms daily as needed  Food allergy: stable - please strictly avoid shellfish and peanuts and tree nuts - for SKIN only reaction, okay to take Benadryl 2 capsules every 4 hours - for SKIN + ANY additional symptoms, OR IF concern for LIFE THREATENING reaction = Epipen Autoinjector EpiPen 0.3 mg. - If using Epinephrine autoinjector, call 911 - A food allergy action plan has been provided and discussed. - Medic Alert identification is recommended.  Oral Allergy Syndrome (watermelon, pineapple):stable Avoid all raw fruits and vegetables that bother you. Allergy injections may improve these symptoms. - Heating these foods, buying them canned, and peeling these foods should allow them to be consumed without symptoms or with less  symptoms.  H/O Penicillin allergy: Asthma must first be controlled.  - please schedule follow-up appt at your convenience for penicillin testing followed by graded oral challenge if indicated-would wait until second trimester and would do skin and intradermal testing first.  Rash-bumpy and itchy on back of neck and upper arms-new problem - triamcinolone 0.01% twice daily as needed. Do not use on face, groin or armpits. -use dye free and scent free products for now -consider patch testing if continues to occur.   Follow up :2-3 months, sooner if needed.  Thank you for allowing me to participate in your care.  Other: none  Tonny Bollman, MD  [facility]

## 2023-07-11 ENCOUNTER — Telehealth: Admitting: *Deleted

## 2023-07-11 ENCOUNTER — Encounter: Payer: Self-pay | Admitting: *Deleted

## 2023-07-11 DIAGNOSIS — O09291 Supervision of pregnancy with other poor reproductive or obstetric history, first trimester: Secondary | ICD-10-CM

## 2023-07-11 DIAGNOSIS — Z3A11 11 weeks gestation of pregnancy: Secondary | ICD-10-CM | POA: Diagnosis not present

## 2023-07-11 DIAGNOSIS — O208 Other hemorrhage in early pregnancy: Secondary | ICD-10-CM | POA: Insufficient documentation

## 2023-07-11 DIAGNOSIS — O99210 Obesity complicating pregnancy, unspecified trimester: Secondary | ICD-10-CM | POA: Insufficient documentation

## 2023-07-11 DIAGNOSIS — O0990 Supervision of high risk pregnancy, unspecified, unspecified trimester: Secondary | ICD-10-CM | POA: Insufficient documentation

## 2023-07-11 DIAGNOSIS — Z8759 Personal history of other complications of pregnancy, childbirth and the puerperium: Secondary | ICD-10-CM | POA: Insufficient documentation

## 2023-07-11 DIAGNOSIS — O09299 Supervision of pregnancy with other poor reproductive or obstetric history, unspecified trimester: Secondary | ICD-10-CM | POA: Insufficient documentation

## 2023-07-11 DIAGNOSIS — O09899 Supervision of other high risk pregnancies, unspecified trimester: Secondary | ICD-10-CM | POA: Insufficient documentation

## 2023-07-11 NOTE — Progress Notes (Signed)
 New OB Intake  I connected with Sonya Castro  on 07/11/23 at  8:15 AM EDT by MyChart Video Visit and verified that I am speaking with the correct person using two identifiers. Nurse is located at [employer] and pt is located at parked car .  I discussed the limitations, risks, security and privacy concerns of performing an evaluation and management service by telephone and the availability of in person appointments. I also discussed with the patient that there may be a patient responsible charge related to this service. The patient expressed understanding and agreed to proceed.  I explained I am completing New OB Intake today. We discussed EDD of 01/25/2024, by Last Menstrual Period. And confirmed with early [place] at [facility]. Pt is [id number]. I reviewed her allergies, medications and Medical/Surgical/OB history.  She also reports had [place] at [facility] on 06/24/22 and they said EDD 01/19/24.   Patient Active Problem List   Diagnosis Date Noted   Current singleton pregnancy with history of congenital anomaly in prior child, antepartum 07/11/2023   Supervision of high risk pregnancy, antepartum 07/11/2023   Obesity affecting pregnancy 07/11/2023   Subchorionic hemorrhage in first trimester 07/11/2023   History of ectopic pregnancy 07/11/2023   History of preterm delivery, currently pregnant 07/11/2023   History of penicillin allergy 05/16/2022   Pollen-food allergy 05/16/2022   Seasonal and perennial allergic rhinitis 05/16/2022   Severe persistent asthma without complication 05/16/2022   Wheezing 06/12/2021   Anxiety 01/13/2021   Hypothyroidism 01/13/2021   Anemia 07/30/2019    Concerns addressed today  Delivery Plans Plans to deliver at [facility]. Discussed the nature of our practice with multiple providers including residents and students. Due to the size of the practice, the delivering provider may not be the same as those providing prenatal care.   Patient is interested in water birth.  Offered upcoming OB visit with CNM to discuss further.  MyChart/Babyscripts MyChart access verified. I explained pt will have some visits in office and some virtually. Babyscripts instructions given and order placed.  Blood Pressure Cuff/Weight Scale Patient has a cuff.  Explained after first prenatal appt pt will check weekly and document in Babyscripts. Patient does have weight scale.  Anatomy US Explained next scheduled [place] will be around 19 weeks. Anatomy US scheduled for 09/03/23 at 1000.  Is patient a CenteringPregnancy candidate?  Is considering, information sent.    Is patient a Mom+Baby Combined Care candidate?  Not a candidate  Interested in [place]?  Yes, sent referral and doula dot phrase.   Is patient a candidate for Babyscripts Optimization? No, due to PTD   First visit review I reviewed new OB appt with patient. Explained pt will be seen by Dr. Vergie Living at first visit. Discussed Avelina Laine genetic screening with patient. She would like both Panorama and Horizon drawn with routine prenatal labs at new ob visit.    Last Pap No results found for: "DIAGPAP"  Nancy Fetter 07/11/2023  9:11 AM

## 2023-07-11 NOTE — Patient Instructions (Signed)
 Options for Doula Care in the [place]  As you review your birthing options, consider having a birth doula. A doula is trained to provide support before, during and just after you give birth. There are also postpartum doulas that help you adjust to new parenthood.  While doulas do not provide medical care, they do provide emotional, physical and educational support. A few months before your baby arrives, doulas can help answer questions, ease concerns and help you create and support your birthing plan.    Doulas can help reduce your stress and comfort you and your partner. They can help you cope with labor by helping you use breathing techniques, massage, creative labor positioning, essential oils and affirmations.   Studies show that the benefits of having a doula include:   A more positive birth experience  Fewer requests for pain-relief medication  Less likelihood of cesarean section, commonly called a c-section   Doulas are typically hired via a [employer] account planner between you and the doula. We are happy to provide a list of the most active doulas in the area, all of whom are credentialed by [facility] and will not count as a visitor at your birth.  There are several options for no-cost doula care at our hospital, including:  [facility] Volunteer Doula Program Every [employer] Program A Cure 4 Moms Doula Study (available only at [employer] for [facility], [place], [place] and [facility] offices)  For more information on these programs or to receive a list of doulas active in our area, please email [email or web address]

## 2023-07-15 ENCOUNTER — Encounter: Payer: Self-pay | Admitting: Obstetrics and Gynecology

## 2023-07-15 ENCOUNTER — Other Ambulatory Visit (HOSPITAL_COMMUNITY)
Admission: RE | Admit: 2023-07-15 | Discharge: 2023-07-15 | Disposition: A | Discharge status: Home / Self Care | Source: Ambulatory Visit | Attending: Family Medicine | Admitting: Family Medicine

## 2023-07-15 ENCOUNTER — Other Ambulatory Visit: Payer: Self-pay

## 2023-07-15 ENCOUNTER — Ambulatory Visit: Payer: Self-pay | Admitting: Obstetrics and Gynecology

## 2023-07-15 VITALS — BP 102/71 | HR 82 | Wt 189.7 lb

## 2023-07-15 DIAGNOSIS — Z3A12 12 weeks gestation of pregnancy: Secondary | ICD-10-CM

## 2023-07-15 DIAGNOSIS — Z6837 Body mass index (BMI) 37.0-37.9, adult: Secondary | ICD-10-CM | POA: Insufficient documentation

## 2023-07-15 DIAGNOSIS — Z31430 Encounter of female for testing for genetic disease carrier status for procreative management: Secondary | ICD-10-CM

## 2023-07-15 DIAGNOSIS — O0990 Supervision of high risk pregnancy, unspecified, unspecified trimester: Secondary | ICD-10-CM | POA: Insufficient documentation

## 2023-07-15 DIAGNOSIS — O0991 Supervision of high risk pregnancy, unspecified, first trimester: Secondary | ICD-10-CM

## 2023-07-15 NOTE — Progress Notes (Signed)
 189.7lbs

## 2023-07-15 NOTE — Progress Notes (Signed)
 New OB Note  07/15/2023   Clinic: Center for [facility]   Chief Complaint: new OB  Transfer of Care Patient: no  History of Present Illness: Sonya Castro is a 33 y.o. [id number] at 12/2 weeks (EDC 10/18, based on Patient's last menstrual period was 04/20/2023.=5wk u/s  Preg complicated by has Anxiety; Hypothyroidism; Seasonal and perennial allergic rhinitis; Severe persistent asthma without complication; Current singleton pregnancy with history of congenital anomaly in prior child, antepartum; Supervision of high risk pregnancy, antepartum; Obesity affecting pregnancy; Subchorionic hemorrhage in first trimester; 2021 35wk PPROM; and BMI 37.0-37.9, adult on their problem list.   No OB s/s. Patient already being followed closely by Allergy/Immunology.   ROS: A 12-point review of systems was performed and negative, except as stated in the above HPI.  OBGYN History: As per HPI. OB History  Gravida Para Term Preterm AB Living  3 1  1 1 1   SAB IAB Ectopic Multiple Live Births    1  1    # Outcome Date GA Lbr Len/2nd Weight Sex Type Anes PTL Lv  3 Current           2 Ectopic 03/2021 [redacted]w[redacted]d            Birth Comments: took methotrexate  1 Preterm 07/31/19 [redacted]w[redacted]d  5 lb 6 oz (2.438 kg) F Vag-Spont EPI N LIV     Birth Comments: PTD, PPROM    Obstetric Comments  2021 PPROM  2022  Methotrexate     Past Medical History: Past Medical History:  Diagnosis Date   Asthma    BV (bacterial vaginosis)    recurrent   Chlamydia    Ectopic pregnancy 03/2021   Heart murmur    as a child   Hypothyroidism    prior to preg, back to normal after del   Pollen-food allergy 05/16/2022   Seasonal and perennial allergic rhinitis 05/16/2022   UTI (urinary tract infection)    Past Surgical History: Past Surgical History:  Procedure Laterality Date   NO PAST SURGERIES     Family History:  Family History  Problem Relation Age of Onset   Anemia Mother    Asthma Father    Social  History:  Social History   Socioeconomic History   Marital status: Single    Spouse name: Not on file   Number of children: Not on file   Years of education: Not on file   Highest education level: Not on file  Occupational History   Not on file  Tobacco Use   Smoking status: Never   Smokeless tobacco: Never  Vaping Use   Vaping status: Former  Substance and Sexual Activity   Alcohol use: Not Currently    Comment: occasional   Drug use: Never   Sexual activity: Yes    Birth control/protection: None    Comment: issues with patch, pill, depo, Implanon, IUD  Other Topics Concern   Not on file  Social History Narrative   Not on file   Social Drivers of Health   Financial Resource Strain: Low Risk  (05/28/2023)   Received from [facility]   Overall Financial Resource Strain (CARDIA)    Difficulty of Paying Living Expenses: Not very hard  Food Insecurity: No Food Insecurity (05/28/2023)   Received from [facility]   Hunger Vital Sign    Worried About Running Out of Food in the Last Year: Never true    Ran Out of Food in the Last Year: Never  true  Transportation Needs: No Transportation Needs (05/28/2023)   Received from [facility] - Transportation    Lack of Transportation (Medical): No    Lack of Transportation (Non-Medical): No  Physical Activity: Sufficiently Active (05/28/2023)   Received from [facility]   Exercise Vital Sign    Days of Exercise per Week: 3 days    Minutes of Exercise per Session: 60 min  Stress: No Stress Concern Present (05/28/2023)   Received from [facility] - Occupational Stress Questionnaire    Feeling of Stress : Only a little  Social Connections: Socially Integrated (05/28/2023)   Received from [facility]   Social Network    How would you rate your social network (family, work, friends)?: Good participation with social networks  Intimate Partner Violence: Not At Risk  (05/28/2023)   Received from [facility]   HITS    Over the last 12 months how often did your partner physically hurt you?: Never    Over the last 12 months how often did your partner insult you or talk down to you?: Never    Over the last 12 months how often did your partner threaten you with physical harm?: Never    Over the last 12 months how often did your partner scream or curse at you?: Never   Allergy: Allergies  Allergen Reactions   Other     TREE NUTS, Positive testing,    Peanut-Containing Drug Products Itching and Swelling    Positive testing   Shellfish Allergy Itching   Amoxicillin Hives   Penicillins Hives, Itching and Swelling   Current Outpatient Medications: Current Outpatient Medications  Medication Sig Dispense Refill   albuterol (VENTOLIN HFA) 108 (90 Base) MCG/ACT inhaler Inhale 2 puffs into the lungs every 6 (six) hours as needed for wheezing or shortness of breath. 8 g 2   azelastine (ASTELIN) 0.1 % nasal spray Place 2 sprays into both nostrils 2 (two) times daily as needed for rhinitis. 30 mL 5   fluticasone (FLONASE) 50 MCG/ACT nasal spray Place 2 sprays into both nostrils daily. 16 g 6   ipratropium-albuterol (DUONEB) 0.5-2.5 (3) MG/3ML SOLN Take 3 mLs by nebulization every 6 (six) hours as needed. 360 mL 2   Nebulizers (COMPRESSOR/NEBULIZER) MISC Inhale 1 kit into the lungs as directed.     Olopatadine HCl (PATADAY) 0.2 % SOLN Place 1 drop into both eyes daily as needed. 2.5 mL 5   Prenatal Vit-Fe Fumarate-FA (MULTIVITAMIN-PRENATAL) 27-0.8 MG TABS tablet Take 1 tablet by mouth daily at 12 noon.     triamcinolone ointment (KENALOG) 0.1 % Apply topically twice daily to BODY as needed for red, sandpaper like rash.  Do not use on face, groin or armpits. 453 g 1   albuterol (PROVENTIL) (2.5 MG/3ML) 0.083% nebulizer solution Take 3 mLs (2.5 mg total) by nebulization every 4 (four) hours as needed for wheezing or shortness of breath. 75 mL 0   albuterol (VENTOLIN  HFA) 108 (90 Base) MCG/ACT inhaler Inhale 2 puffs into the lungs every 6 (six) hours as needed for wheezing or shortness of breath. 8 g 2   budesonide-formoterol (SYMBICORT) 160-4.5 MCG/ACT inhaler Inhale 2 puffs into the lungs in the morning and at bedtime. 1 each 5   cetirizine (ZYRTEC ALLERGY) 10 MG tablet Take 1 tablet (10 mg total) by mouth daily as needed for allergies. (Patient not taking: Reported on 07/15/2023) 31 tablet 5   dupilumab (DUPIXENT) 300  MG/2ML prefilled syringe Inject 300 mg into the skin every 14 (fourteen) days. (Patient not taking: Reported on 07/15/2023) 4 mL 11   EPINEPHrine (EPIPEN 2-PAK) 0.3 mg/0.3 mL IJ SOAJ injection Inject 0.3 mg into the muscle as needed for anaphylaxis. (Patient not taking: Reported on 07/15/2023) 2 each 2   montelukast (SINGULAIR) 10 MG tablet Take 1 tablet (10 mg total) by mouth at bedtime. 90 tablet 1   ondansetron (ZOFRAN) 4 MG tablet Take 1 tablet (4 mg total) by mouth every 8 (eight) hours as needed for nausea or vomiting. (Patient not taking: Reported on 02/11/2023) 12 tablet 0   No current facility-administered medications for this visit.   Physical Exam:   BP 102/71   Pulse 82   Wt 189 lb 11.2 oz (86 kg)   LMP 04/20/2023   BMI 38.31 kg/m  Body mass index is 38.31 kg/m. Contractions: Not present Vag. Bleeding: None.  General appearance: Well nourished, well developed female in no acute distress.  Neck:  Supple, normal appearance, and no thyromegaly  Cardiovascular: S1, S2 normal, no murmur, rub or gallop, regular rate and rhythm Respiratory:  Clear to auscultation bilateral. Normal respiratory effort Abdomen: positive bowel sounds and no masses, hernias; diffusely non tender to palpation, non distended Breasts: patient denies any breast s/s. Neuro/Psych:  Normal mood and affect.  Skin:  Warm and dry.  Lymphatic:  No inguinal lymphadenopathy.   Pelvic exam: is not limited by body habitus EGBUS: within normal limits, Vagina: within  normal limits and with no blood in the vault, Cervix: normal appearing cervix without discharge or lesions, closed/long/high, Uterus:  enlarged, c/w 10-12 week size, and Adnexa:  normal adnexa and no mass, fullness, tenderness  Laboratory: none  Imaging:  As per HPI  Assessment: patient doing well.   Plan: 1. [redacted] weeks gestation of pregnancy (Primary) Recommend afp next visit Will touch base with A/I to see if low dose aspirin okay - Cytology - PAP( [place]) - GC/Chlamydia probe amp ([place])not at [facility] - Hemoglobin A1c - Culture, OB Urine - CBC/D/Plt+RPR+Rh+ABO+RubIgG... - TSH Rfx on Abnormal to Free T4 - Comprehensive metabolic panel with GFR - Protein / creatinine ratio, urine  2. Supervision of high risk pregnancy, antepartum - Cytology - PAP( [place]) - GC/Chlamydia probe amp ()not at [facility] - Hemoglobin A1c - Culture, OB Urine - CBC/D/Plt+RPR+Rh+ABO+RubIgG... - TSH Rfx on Abnormal to Free T4 - Comprehensive metabolic panel with GFR - Protein / creatinine ratio, urine  3. High-risk pregnancy - PANORAMA PRENATAL TEST  4. Encounter of female for testing for genetic disease carrier status for procreative management - [facility] Basic Panel  5. Allergies and Asthma Followed by them already (see epic). Will touch base re: safety of DUPILUMAB, which she takes q14d. Nothing overtly bad about it on literature search and I told her to stay on it for now.  I'll also send A&A note re: okay to do low dose aspirin in her situation.   6. History of hypothyroidism TSH today; pt not on any medications  7. 2021 35wk PPROM D/w her no need for interventions this pregnancy  8. H/o congential heart defect F/u MFM anatomy u/s; likely will need fetal screening echo at 22-26wks  Problem list reviewed and updated.  Follow up in 3 weeks.   >50% of 40 min visit spent on counseling and coordination of care.    Future Appointments  Date Time Provider Department  Center  08/29/2023  8:00 AM [facility] NURSE INTAKE  [facility]  09/03/2023 10:00 AM [facility] PROVIDER 1 [facility]  09/03/2023 10:30 AM [facility] US3 [facility]    Cornelia Copa MD Attending Center for [facility])

## 2023-07-16 ENCOUNTER — Encounter: Payer: Self-pay | Admitting: Obstetrics and Gynecology

## 2023-07-16 ENCOUNTER — Encounter: Payer: Self-pay | Admitting: *Deleted

## 2023-07-16 LAB — CBC/D/PLT+RPR+RH+ABO+RUBIGG...
Antibody Screen: NEGATIVE
Basophils Absolute: 0.1 10*3/uL (ref 0.0–0.2)
Basos: 1 %
EOS (ABSOLUTE): 0.1 10*3/uL (ref 0.0–0.4)
Eos: 2 %
HCV Ab: NONREACTIVE
HIV Screen 4th Generation wRfx: NONREACTIVE
Hematocrit: 34.8 % (ref 34.0–46.6)
Hemoglobin: 11.5 g/dL (ref 11.1–15.9)
Hepatitis B Surface Ag: NEGATIVE
Immature Grans (Abs): 0 10*3/uL (ref 0.0–0.1)
Immature Granulocytes: 0 %
Lymphocytes Absolute: 2.3 10*3/uL (ref 0.7–3.1)
Lymphs: 27 %
MCH: 27.3 pg (ref 26.6–33.0)
MCHC: 33 g/dL (ref 31.5–35.7)
MCV: 83 fL (ref 79–97)
Monocytes Absolute: 0.4 10*3/uL (ref 0.1–0.9)
Monocytes: 4 %
Neutrophils Absolute: 5.6 10*3/uL (ref 1.4–7.0)
Neutrophils: 66 %
Platelets: 364 10*3/uL (ref 150–450)
RBC: 4.21 x10E6/uL (ref 3.77–5.28)
RDW: 12.5 % (ref 11.7–15.4)
RPR Ser Ql: NONREACTIVE
Rh Factor: POSITIVE
Rubella Antibodies, IGG: 11.4 {index} (ref >0.99)
WBC: 8.5 10*3/uL (ref 3.4–10.8)

## 2023-07-16 LAB — COMPREHENSIVE METABOLIC PANEL WITH GFR
ALT: 10 IU/L (ref 0–32)
AST: 14 IU/L (ref 0–40)
Albumin: 3.9 g/dL (ref 3.9–4.9)
Alkaline Phosphatase: 82 IU/L (ref 44–121)
BUN/Creatinine Ratio: 7 — ABNORMAL LOW (ref 9–23)
BUN: 4 mg/dL — ABNORMAL LOW (ref 6–20)
Bilirubin Total: <0.2 mg/dL (ref 0.0–1.2)
CO2: 18 mmol/L — ABNORMAL LOW (ref 20–29)
Calcium: 9.2 mg/dL (ref 8.7–10.2)
Chloride: 101 mmol/L (ref 96–106)
Creatinine, Ser: 0.54 mg/dL — ABNORMAL LOW (ref 0.57–1.00)
Globulin, Total: 2.4 g/dL (ref 1.5–4.5)
Glucose: 80 mg/dL (ref 70–99)
Potassium: 4.1 mmol/L (ref 3.5–5.2)
Sodium: 136 mmol/L (ref 134–144)
Total Protein: 6.3 g/dL (ref 6.0–8.5)
eGFR: 125 mL/min/{1.73_m2} (ref >59)

## 2023-07-16 LAB — HCV INTERPRETATION

## 2023-07-16 LAB — GC/CHLAMYDIA PROBE AMP (~~LOC~~) NOT AT ARMC
Chlamydia: NEGATIVE
Comment: NEGATIVE
Comment: NORMAL
Neisseria Gonorrhea: NEGATIVE

## 2023-07-16 LAB — HEMOGLOBIN A1C
Est. average glucose Bld gHb Est-mCnc: 114 mg/dL
Hgb A1c MFr Bld: 5.6 % (ref 4.8–5.6)

## 2023-07-16 LAB — TSH RFX ON ABNORMAL TO FREE T4: TSH: 0.694 u[IU]/mL (ref 0.450–4.500)

## 2023-07-17 LAB — PROTEIN / CREATININE RATIO, URINE
Creatinine, Urine: 37.2 mg/dL
Protein, Ur: 8.3 mg/dL
Protein/Creat Ratio: 223 mg/g{creat} — ABNORMAL HIGH (ref 0–200)

## 2023-07-17 LAB — CULTURE, OB URINE

## 2023-07-17 LAB — CYTOLOGY - PAP
Comment: NEGATIVE
Diagnosis: UNDETERMINED — AB
High risk HPV: NEGATIVE

## 2023-07-17 LAB — URINE CULTURE, OB REFLEX

## 2023-07-20 LAB — PANORAMA PRENATAL TEST FULL PANEL:PANORAMA TEST PLUS 5 ADDITIONAL MICRODELETIONS: FETAL FRACTION: 6.6

## 2023-07-24 LAB — HORIZON CUSTOM: REPORT SUMMARY: POSITIVE — AB

## 2023-07-26 ENCOUNTER — Encounter: Payer: Self-pay | Admitting: Obstetrics and Gynecology

## 2023-07-26 DIAGNOSIS — D563 Thalassemia minor: Secondary | ICD-10-CM | POA: Insufficient documentation

## 2023-08-15 ENCOUNTER — Other Ambulatory Visit: Payer: Self-pay

## 2023-08-15 ENCOUNTER — Ambulatory Visit: Admitting: Family Medicine

## 2023-08-15 VITALS — BP 119/67 | HR 68 | Wt 189.0 lb

## 2023-08-15 DIAGNOSIS — O09892 Supervision of other high risk pregnancies, second trimester: Secondary | ICD-10-CM

## 2023-08-15 DIAGNOSIS — E039 Hypothyroidism, unspecified: Secondary | ICD-10-CM

## 2023-08-15 DIAGNOSIS — O09299 Supervision of pregnancy with other poor reproductive or obstetric history, unspecified trimester: Secondary | ICD-10-CM

## 2023-08-15 DIAGNOSIS — O0990 Supervision of high risk pregnancy, unspecified, unspecified trimester: Secondary | ICD-10-CM

## 2023-08-15 DIAGNOSIS — O0992 Supervision of high risk pregnancy, unspecified, second trimester: Secondary | ICD-10-CM

## 2023-08-15 DIAGNOSIS — D563 Thalassemia minor: Secondary | ICD-10-CM

## 2023-08-15 DIAGNOSIS — J4550 Severe persistent asthma, uncomplicated: Secondary | ICD-10-CM

## 2023-08-15 DIAGNOSIS — O09899 Supervision of other high risk pregnancies, unspecified trimester: Secondary | ICD-10-CM

## 2023-08-15 DIAGNOSIS — Z3A16 16 weeks gestation of pregnancy: Secondary | ICD-10-CM

## 2023-08-15 DIAGNOSIS — O09292 Supervision of pregnancy with other poor reproductive or obstetric history, second trimester: Secondary | ICD-10-CM

## 2023-08-15 NOTE — Progress Notes (Signed)
   PRENATAL VISIT NOTE  Subjective:  Sonya Castro is a 33 y.o. [id number] at [redacted]w[redacted]d being seen today for ongoing prenatal care.  She is currently monitored for the following issues for this high-risk pregnancy and has Anxiety; Hypothyroidism; Seasonal and perennial allergic rhinitis; Severe persistent asthma without complication; Current singleton pregnancy with history of congenital anomaly in prior child, antepartum; Supervision of high risk pregnancy, antepartum; Obesity affecting pregnancy; 2021 35wk PPROM; BMI 37.0-37.9, adult; and Alpha thalassemia silent carrier on their problem list.  Patient reports worsening asthma. Off her dupixent  and symbicort . On no preventive.  Contractions: Not present. Vag. Bleeding: None.  Movement: Absent. Denies leaking of fluid.   The following portions of the patient's history were reviewed and updated as appropriate: allergies, current medications, past family history, past medical history, past social history, past surgical history and problem list.   Objective:   Vitals:   08/15/23 0825 08/15/23 0902  BP: (!) 130/93 119/67  Pulse: (!) 109 68  Weight: 189 lb (85.7 kg)     Fetal Status: Fetal Heart Rate (bpm): 155   Movement: Absent     General:  Alert, oriented and cooperative. Patient is in no acute distress.  Skin: Skin is warm and dry. No rash noted.   Cardiovascular: Normal heart rate noted  Respiratory: Normal respiratory effort, no problems with respiration noted  Abdomen: Soft, gravid, appropriate for gestational age.  Pain/Pressure: Absent     Pelvic: Cervical exam deferred        Extremities: Normal range of motion.  Edema: None  Mental Status: Normal mood and affect. Normal behavior. Normal judgment and thought content.   Assessment and Plan:  Pregnancy: [id number] at [redacted]w[redacted]d 1. Supervision of high risk pregnancy, antepartum (Primary) AFP today Has anatomy u/s scheduled - AFP, Serum, Open Spina Bifida  2. 2021 35wk PPROM At risk of PTL  again  3. [redacted] weeks gestation of pregnancy   4. Severe persistent asthma without complication Messaged her allergy /asthma specialist about resuming her meds. She is using rescue up to 4x/day and feels quite SOB in the evenings.  Also, not taking anti-allergy  meds. She is outside of the 1st trimester. Suggest resumption of meds to optimize asthma control.  5. Acquired hypothyroidism Normal TFT's. On no meds since after giving birth last pregnancy.  6. Alpha thalassemia silent carrier Offer partner testing Refer to genetics - Ambulatory referral to Genetics  7. Current singleton pregnancy with history of congenital anomaly in prior child, antepartum Congenital heart, requiring surgery Needs Fetal ECHO - Ambulatory referral to Pediatric Cardiology  General obstetric precautions including but not limited to vaginal bleeding, contractions, leaking of fluid and fetal movement were reviewed in detail with the patient. Please refer to After Visit Summary for other counseling recommendations.   Return in 4 weeks (on 09/12/2023).  Future Appointments  Date Time Provider Department Center  09/03/2023 10:00 AM [facility] PROVIDER 1 [facility]  09/03/2023 10:30 AM [facility] US3 [facility]  09/16/2023 10:15 AM Raynell Caller, MD [facility]    Granville Layer, MD

## 2023-08-17 LAB — AFP, SERUM, OPEN SPINA BIFIDA
AFP MoM: 0.93
AFP Value: 32.1 ng/mL
Gest. Age on Collection Date: 16.7 wk
Maternal Age At EDD: 32.6 a
OSBR Risk 1 IN: 10000
Test Results:: NEGATIVE
Weight: 189 [lb_av]

## 2023-08-19 ENCOUNTER — Encounter: Payer: Self-pay | Admitting: Internal Medicine

## 2023-08-19 ENCOUNTER — Ambulatory Visit (INDEPENDENT_AMBULATORY_CARE_PROVIDER_SITE_OTHER): Admitting: Internal Medicine

## 2023-08-19 ENCOUNTER — Encounter: Payer: Self-pay | Admitting: Family Medicine

## 2023-08-19 ENCOUNTER — Other Ambulatory Visit: Payer: Self-pay

## 2023-08-19 VITALS — BP 98/56 | HR 96 | Temp 98.1°F | Wt 193.3 lb

## 2023-08-19 DIAGNOSIS — Z880 Allergy status to penicillin: Secondary | ICD-10-CM | POA: Insufficient documentation

## 2023-08-19 DIAGNOSIS — T781XXA Other adverse food reactions, not elsewhere classified, initial encounter: Secondary | ICD-10-CM

## 2023-08-19 DIAGNOSIS — J3089 Other allergic rhinitis: Secondary | ICD-10-CM

## 2023-08-19 DIAGNOSIS — T781XXD Other adverse food reactions, not elsewhere classified, subsequent encounter: Secondary | ICD-10-CM

## 2023-08-19 DIAGNOSIS — J4550 Severe persistent asthma, uncomplicated: Secondary | ICD-10-CM | POA: Diagnosis not present

## 2023-08-19 DIAGNOSIS — T7800XA Anaphylactic reaction due to unspecified food, initial encounter: Secondary | ICD-10-CM | POA: Insufficient documentation

## 2023-08-19 DIAGNOSIS — H1013 Acute atopic conjunctivitis, bilateral: Secondary | ICD-10-CM

## 2023-08-19 DIAGNOSIS — J302 Other seasonal allergic rhinitis: Secondary | ICD-10-CM | POA: Diagnosis not present

## 2023-08-19 MED ORDER — ALBUTEROL SULFATE HFA 108 (90 BASE) MCG/ACT IN AERS: 2.0000 | INHALATION_SPRAY | Freq: Four times a day (QID) | RESPIRATORY_TRACT | 2 refills | Status: AC | PRN

## 2023-08-19 MED ORDER — AZELASTINE-FLUTICASONE 137-50 MCG/ACT NA SUSP: 1.0000 | Freq: Two times a day (BID) | NASAL | 5 refills | Status: AC | PRN

## 2023-08-19 MED ORDER — FEXOFENADINE HCL 180 MG PO TABS: 180.0000 mg | ORAL_TABLET | Freq: Every day | ORAL | 5 refills | Status: AC

## 2023-08-19 MED ORDER — MONTELUKAST SODIUM 10 MG PO TABS: 10.0000 mg | ORAL_TABLET | Freq: Every day | ORAL | 5 refills | Status: AC

## 2023-08-19 MED ORDER — CROMOLYN SODIUM 4 % OP SOLN: 1.0000 [drp] | Freq: Four times a day (QID) | OPHTHALMIC | 3 refills | Status: AC | PRN

## 2023-08-19 MED ORDER — BUDESONIDE-FORMOTEROL FUMARATE 160-4.5 MCG/ACT IN AERO
2.0000 | INHALATION_SPRAY | Freq: Two times a day (BID) | RESPIRATORY_TRACT | 5 refills | Status: DC
Start: 2023-08-19 — End: 2023-08-20

## 2023-08-19 NOTE — Patient Instructions (Addendum)
 Severe Persistent Asthma: not controlled - Controller Inhaler: Start Symbicort  160 mcg 2 puffs twice a day; This Should Be Used Everyday - Rinse mouth out after use - use with a spacer -Restart Singulair  (montelukast ) continue 10 mg daily - stop if nightmares of mood changes. - Rescue Inhaler: albuterol  2-4 puffs every 4 hours as needed or  1 vial albuterol  Use  every 4-6 hours as needed for chest tightness, wheezing, or coughing.  Can also use 15 minutes prior to exercise if you have symptoms with activity. - Asthma is not controlled if:  - Symptoms are occurring >2 times a week OR  - >2 times a month nighttime awakenings  - You are requiring systemic steroids (prednisone /steroid injections) more than once per year  - Your require hospitalization for your asthma.  - Please call the clinic to schedule a follow up if these symptoms arise -restart dupixent  injections-will reach out to [facility]; this has not been studied in pregnancy but there are patients who have continued during pregnagyy without any obvious harm to baby. If you would like, you can register for Dupixent  pregnancy register:   You can enroll in the registry or get more information by calling [phone] or visiting [email or web address].   We will submit paperwork for Dupixent . You will hear from our biologics coordinator Tammy VonCannon. Please answer her phone calls to ensure a seamless approval process.  Use Breztri  inhaler sample 2 puffs twice daily until able to pick up Symbicort    Seasonal and Perennial Allergic Rhinitis with anosmia without nasal polyps - 2024 allergy  testing positive to grass pollen, weed pollen, tree pollen, outdoor molds, cat, dog and horse; cockroach, mold mix 2 (indoor molds), dust mites and ragweed mix - Prevention:  - allergen avoidance when possible - consider allergy  shots as long term control of your symptoms by teaching your immune system to be  more tolerant of your allergy  triggers  - asthma must first be controlled and after giving birth. - Symptom control: First use saline rinses twice daily then use flonase  and astelin . - Start Singulair  (Montelukast ) 10mg  nightly.   - Discontinue if nightmares of behavior changes. - Start Antihistamine: Allegra (fexofenadine) 180 mg daily as needed. This will replace cetirizine . - Start Dymista 1 spray twice daily as needed.   Allergic Conjunctivitis:  - Start cromolyn 1 drop each eye up to 4 times daily as needed. This will replace pataday .  Food allergy :  - please strictly avoid shellfish and peanuts and tree nuts - for SKIN only reaction, okay to take Benadryl  2 capsules every 4 hours - for SKIN + ANY additional symptoms, OR IF concern for LIFE THREATENING reaction = Epipen  Autoinjector EpiPen  0.3 mg. - If using Epinephrine  autoinjector, call 911 - A food allergy  action plan has been provided and discussed. - Medic Alert identification is recommended.  Oral Allergy  Syndrome (watermelon, pineapple): Avoid all raw fruits and vegetables that bother you. Allergy  injections may improve these symptoms. - Heating these foods, buying them canned, and peeling these foods should allow them to be consumed without symptoms or with less symptoms.  H/O Penicillin allergy : Asthma must first be controlled. - please schedule follow-up appt at your convenience for penicillin testing followed by graded oral challenge if indicated-can do skin testing followed by challenge at your convenience.   Follow up :4-6 weeks, sooner if needed.  Thank you for allowing me to participate in your care.  Jonathon Neighbors, MD [facility]  Reducing Pollen Exposure  The American Academy of Allergy , Asthma and Immunology suggests the following steps to reduce your exposure to pollen during allergy  seasons.    Do not hang sheets or clothing out to dry; pollen may collect on these items. Do not mow  lawns or spend time around freshly cut grass; mowing stirs up pollen. Keep windows closed at night.  Keep car windows closed while driving. Minimize morning activities outdoors, a time when pollen counts are usually at their highest. Stay indoors as much as possible when pollen counts or humidity is high and on windy days when pollen tends to remain in the air longer. Use air conditioning when possible.  Many air conditioners have filters that trap the pollen spores. Use a HEPA room air filter to remove pollen form the indoor air you breathe. Control of Dog or Cat Allergen  Avoidance is the best way to manage a dog or cat allergy . If you have a dog or cat and are allergic to dog or cats, consider removing the dog or cat from the home. If you have a dog or cat but don't want to find it a new home, or if your family wants a pet even though someone in the household is allergic, here are some strategies that may help keep symptoms at bay:  Keep the pet out of your bedroom and restrict it to only a few rooms. Be advised that keeping the dog or cat in only one room will not limit the allergens to that room. Don't pet, hug or kiss the dog or cat; if you do, wash your hands with soap and water. High-efficiency particulate air (HEPA) cleaners run continuously in a bedroom or living room can reduce allergen levels over time. Regular use of a high-efficiency vacuum cleaner or a central vacuum can reduce allergen levels. Giving your dog or cat a bath at least once a week can reduce airborne allergen. Control of Mold Allergen   Mold and fungi can grow on a variety of surfaces provided certain temperature and moisture conditions exist.  Outdoor molds grow on plants, decaying vegetation and soil.  The major outdoor mold, Alternaria and Cladosporium, are found in very high numbers during hot and dry conditions.  Generally, a late Summer - Fall peak is seen for common outdoor fungal spores.  Rain will temporarily  lower outdoor mold spore count, but counts rise rapidly when the rainy period ends.  The most important indoor molds are Aspergillus and Penicillium.  Dark, humid and poorly ventilated basements are ideal sites for mold growth.  The next most common sites of mold growth are the bathroom and the kitchen.  Outdoor (Seasonal) Mold Control  Use air conditioning and keep windows closed Avoid exposure to decaying vegetation. Avoid leaf raking. Avoid grain handling. Consider wearing a face mask if working in moldy areas.    Indoor (Perennial) Mold Control   Maintain humidity below 50%. Clean washable surfaces with 5% bleach solution. Remove sources e.g. contaminated carpets. DUST MITE AVOIDANCE MEASURES:  There are three main measures that need and can be taken to avoid house dust mites:  Reduce accumulation of dust in general -reduce furniture, clothing, carpeting, books, stuffed animals, especially in bedroom  Separate yourself from the dust -use pillow and mattress encasements (can be found at stores such as [employer] or online) -avoid direct exposure to air condition flow -use a HEPA filter device, especially in the bedroom; you can also use a HEPA filter vacuum cleaner -wipe dust with  a moist towel instead of a dry towel or broom when cleaning  Decrease mites and/or their secretions -wash clothing and linen and stuffed animals at highest temperature possible, at least every 2 weeks -stuffed animals can also be placed in a bag and put in a freezer overnight  Despite the above measures, it is impossible to eliminate dust mites or their allergen completely from your home.  With the above measures the burden of mites in your home can be diminished, with the goal of minimizing your allergic symptoms.  Success will be reached only when implementing and using all means together.

## 2023-08-19 NOTE — Progress Notes (Signed)
 FOLLOW UP Date of Service/Encounter:  08/19/23  Subjective:  Sonya Castro (DOB: September 11, 1990) is a 33 y.o. female who [facility] in re-evaluation of the following: severe persistent asthma, seaonsal and perennial allergic rhinitis and conjunctivitis, oral allergy  syndrome, penicillin allergy , now 17 weeks  History obtained from: chart review and patient.  For Review, LV was on 06/10/23  with Dr.Donika Butner seen for routine follow-up. See below for summary of history and diagnostics.   Therapeutic plans/changes recommended: she reported being [redacted] weeks pregnant, worsening SOB, off dupixent  due to insurance issues coordinating, FEV1 92%, asthma poorly controlled. We discussed restarting dupixent  without the loading dose and continuing symbicort  which was covered by her insurance.  ----------------------------------------------------- Pertinent History/Diagnostics:  Asthma: Diagnosed as an adult, age 89 yo. Using rescue inhaler multiple times per day at work.  Triggers: fumes, scents, dog-has dog in the home. Roaches. exercise  AEC 600 on 04/21/21. She received prednisone  at least 6 times in the past year.  Seen in ED 4 times for asthma/bronchitis in 2023. -  spirometry (05/16/22): ratio 0.67, 94% FEV1 (pre), 87% FEV1 (post)-mild obstruction without postbronchodilator response - labs: 05/16/22: IgE 2410, AEC 400 -06/20/22: planned to start dupixent , continue Breztri  160 mcg 2 puffs BID, singulair  and Airsupra  PRN. Dupi approved in March, but never started. Allergic Rhinitis:  Ongoing nasal congestion, hyposmia. Right nostril is harder for her breath.  She can't taste and smell.  She has been having trouble with this for 3.5 months.  No ENT surgeries.  - SPT environmental panel (05/16/22):  skin testing positive to grass pollen, weed pollen, tree pollen, outdoor molds, cat, dog and horse Intradermal testing was positive to cockroach, mold mix 2 (indoor  molds), dust - 06/20/22-rehomed her dog and put in air purifier and reported improved symptoms Plan: singulair , OTC AH, consider AIT once asthma controlled, pataday  PRN Pollen food allergy  syndrome: Pineapple, watermelon-makes her itchy.  She continues to eat these fruits despite these symptoms. Only happens when raw and fresh. Food Allergies-nuts and shellfish Shellfish: she gets itchy all over, including her throat. She has avoided for over one year. Does not carry an epipen .  Peanuts: tongue swelling and mouth itching immediately after eating a snickers bar - SPT negative to shellfish 05/16/22. - labs 05/16/22: positive to shrimp 4.62, crab 0.31, clam 0.26, scallop 0.30, oyster 0.24, lobster 0.16 - labs 06/05/22: peanut  8.89 (ara h2 0.13, ara h8 13.40, arah9 2.53), hazelnut 18, walnut 1.56, cashew 0.37, [place] nut 0.24, macadamia 1.69, pecan 0.42, pistachio 1.48, almond 3.94 Penicillin allergy :  Hives and sensation of throat closing. Last given in childhood.  Symptoms lasted around 2.5 days.  Testing and challenge offered --------------------------------------------------- Today presents for follow-up. Discussed the use of AI scribe software for clinical note transcription with the patient, who gave verbal consent to proceed.  History of Present Illness   Sonya Castro is a 33 year old female with asthma who presents with worsening asthma symptoms during pregnancy.  She is experiencing nocturnal dyspnea, waking up in the middle of the night with difficulty breathing. She has been off all asthma medications for almost a month, following advice during her initial prenatal appointment, but has not received further guidance since then from initial provider.  She has been using her albuterol  inhaler up to four times a day and experiences shortness of breath, sneezing, and mucus production. She has not been on Symbicort  or Dupixent  since prior to March but did try to manage symptoms with  Breztri ,  which she received as a sample, but her insurance does not cover it. She also uses azelastine  and Flonase  nasal sprays, which provide partial relief. She is still symptomatic despite using Breztri . Additionally she is taking cetirizine  and still experiences significant drainage and congestion.  She has a history of allergies to shellfish, peanuts, and tree nuts, and she avoids these foods. She carries an EpiPen  for emergencies. She experiences itchy eyes and uses olopatadine  eye drops and an over-the-counter allergy  eye drop for relief.  Her asthma symptoms have worsened over the past month, with approximately 12 nights of waking up with breathing difficulties. She has not been prescribed prednisone  or any other systemic treatments recently. She was told to follow-up with allergy .  She is currently pregnant and has undergone genetic testing, which showed normal results. She is scheduled for an anatomy scan on the 27th. Her baby is reportedly growing well, with a strong heartbeat and normal size, although she may be a large baby.      Chart Review: Received a message from patint's OB Dr. Adriana Hopping letting us  know patient's asthma uncontrolled and off all meds on   All medications reviewed by clinical staff and updated in chart. No new pertinent medical or surgical history except as noted in HPI.  ROS: All others negative except as noted per HPI.   Objective:  BP (!) 98/56 (BP Location: Left Arm, Patient Position: Sitting, Cuff Size: Normal)   Pulse 96   Temp 98.1 F (36.7 C) (Temporal)   Wt 193 lb 4.8 oz (87.7 kg)   LMP 04/20/2023   SpO2 99%   BMI 39.04 kg/m  Body mass index is 39.04 kg/m. Physical Exam: General Appearance:  Alert, cooperative, no distress, appears stated age  Head:  Normocephalic, without obvious abnormality, atraumatic  Eyes:  Conjunctiva clear, EOM's intact  Ears EACs normal bilaterally and normal TMs bilaterally  Nose: Nares normal, hypertrophic turbinates, normal  mucosa, and no visible anterior polyps  Throat: Lips, tongue normal; teeth and gums normal, normal posterior oropharynx  Neck: Supple, symmetrical  Lungs:   clear to auscultation bilaterally, Respirations unlabored, no coughing  Heart:  regular rate and rhythm and no murmur, Appears well perfused  Extremities: No edema  Skin: Skin color, texture, turgor normal and no rashes or lesions on visualized portions of skin  Neurologic: No gross deficits   Labs:  Lab Orders  No laboratory test(s) ordered today    Spirometry:  Tracings reviewed. Her effort: Good reproducible efforts. FVC: 2.59L FEV1: 1.78L, 74% predicted FEV1/FVC ratio: 0.69 Interpretation: Spirometry consistent with mild obstructive disease.  Please see scanned spirometry results for details.   Assessment/Plan   Severe Persistent Asthma: not controlled - Controller Inhaler: Start Symbicort  160 mcg 2 puffs twice a day; This Should Be Used Everyday - Rinse mouth out after use - use with a spacer -Restart Singulair  (montelukast ) continue 10 mg daily - stop if nightmares of mood changes. - Rescue Inhaler: albuterol  2-4 puffs every 4 hours as needed or  1 vial albuterol  Use  every 4-6 hours as needed for chest tightness, wheezing, or coughing.  Can also use 15 minutes prior to exercise if you have symptoms with activity. - Asthma is not controlled if:  - Symptoms are occurring >2 times a week OR  - >2 times a month nighttime awakenings  - You are requiring systemic steroids (prednisone /steroid injections) more than once per year  - Your require hospitalization for your asthma.  - Please call the  clinic to schedule a follow up if these symptoms arise -restart dupixent  injections-will reach out to [facility]; this has not been studied in pregnancy but there are patients who have continued during pregnagyy without any obvious harm to baby. If you would like, you can register for Dupixent  pregnancy register:   You can  enroll in the registry or get more information by calling [phone] or visiting [email or web address].   We will submit paperwork for Dupixent . You will hear from our biologics coordinator Tammy VonCannon. Please answer her phone calls to ensure a seamless approval process.  Use Breztri  inhaler sample 2 puffs twice daily until able to pick up Symbicort    Seasonal and Perennial Allergic Rhinitis with anosmia without nasal polyps - 2024 allergy  testing positive to grass pollen, weed pollen, tree pollen, outdoor molds, cat, dog and horse; cockroach, mold mix 2 (indoor molds), dust mites and ragweed mix - Prevention:  - allergen avoidance when possible - consider allergy  shots as long term control of your symptoms by teaching your immune system to be more tolerant of your allergy  triggers  - asthma must first be controlled and after giving birth. - Symptom control: First use saline rinses twice daily then use flonase  and astelin . - Start Singulair  (Montelukast ) 10mg  nightly.   - Discontinue if nightmares of behavior changes. - Start Antihistamine: Allegra (fexofenadine) 180 mg daily as needed. This will replace cetirizine . - Start Dymista 1 spray twice daily as needed.   Allergic Conjunctivitis:  - Start cromolyn 1 drop each eye up to 4 times daily as needed. This will replace pataday .  Food allergy :  - please strictly avoid shellfish and peanuts and tree nuts - for SKIN only reaction, okay to take Benadryl  2 capsules every 4 hours - for SKIN + ANY additional symptoms, OR IF concern for LIFE THREATENING reaction = Epipen  Autoinjector EpiPen  0.3 mg. - If using Epinephrine  autoinjector, call 911 - A food allergy  action plan has been provided and discussed. - Medic Alert identification is recommended.  Oral Allergy  Syndrome (watermelon, pineapple): Avoid all raw fruits and vegetables that bother you. Allergy  injections may improve these symptoms. -  Heating these foods, buying them canned, and peeling these foods should allow them to be consumed without symptoms or with less symptoms.  H/O Penicillin allergy : Asthma must first be controlled. - please schedule follow-up appt at your convenience for penicillin testing followed by graded oral challenge if indicated-can do skin testing followed by challenge at your convenience.   Follow up :4-6 weeks, sooner if needed.  Thank you for allowing me to participate in your care.  Other: samples provided of Breztri   Jonathon Neighbors, MD  [facility] 

## 2023-08-20 ENCOUNTER — Telehealth: Payer: Self-pay | Admitting: *Deleted

## 2023-08-20 MED ORDER — BUDESONIDE-FORMOTEROL FUMARATE 160-4.5 MCG/ACT IN AERO: 2.0000 | INHALATION_SPRAY | Freq: Two times a day (BID) | RESPIRATORY_TRACT | 1 refills | Status: AC

## 2023-08-20 NOTE — Telephone Encounter (Signed)
 Called and informed patient of the message per Tammy and Dr. Cornel Diesel. Patient has been scheduled and Symbicort  sent to the pharmacy of her choice.

## 2023-08-20 NOTE — Addendum Note (Signed)
 Addended by: Anthon Baston A on: 08/20/2023 06:51 PM   Modules accepted: Orders

## 2023-08-20 NOTE — Telephone Encounter (Signed)
-----   Message from Sean Czar sent at 08/19/2023  5:08 PM EDT ----- Hi Sonya Castro-can we restart her on dupixent ? She is pregnant but has been on before, now in 2nd trimester and poorly controlled.

## 2023-08-20 NOTE — Telephone Encounter (Signed)
 Patient Ins has denied Dupixent  restart due to patient not adherent to ICS and additional controller consistently for 3 months minimum

## 2023-08-20 NOTE — Telephone Encounter (Signed)
 Thanks Tammy. I will have the [place] [facility] group call her to let her know.  [place] [facility]-can we let Sonya Castro know that her insurance is not going to cover dupixent  until she has been on Symbicort  160 mcg for at least 3 months consistently. If she is not controlled on Symbicort , we can send in some other medications. Have her come back in 2-4 weeks to see me. Thanks!

## 2023-08-29 ENCOUNTER — Ambulatory Visit

## 2023-09-03 ENCOUNTER — Other Ambulatory Visit

## 2023-09-16 ENCOUNTER — Encounter: Admitting: Obstetrics and Gynecology

## 2023-09-16 ENCOUNTER — Telehealth: Payer: Self-pay | Admitting: Family Medicine

## 2023-09-16 NOTE — Telephone Encounter (Signed)
 Patient no showed appointment on 6/9 I called patient to reschedule and was sent to voicemail. I left patient a detailed message with new appointment information and call back number. I let patient know if she cannot make that appointment day or time to give us  a call to reschedule.

## 2023-09-19 ENCOUNTER — Encounter: Admitting: Advanced Practice Midwife

## 2023-09-19 NOTE — Progress Notes (Deleted)
   PRENATAL VISIT NOTE  Subjective:  Sonya Castro is a 33 y.o. [phone] at [redacted]w[redacted]d being seen today for ongoing prenatal care.  She is currently monitored for the following issues for this high-risk pregnancy and has Hypothyroidism; Pollen-food allergy ; Seasonal and perennial allergic rhinitis; Severe persistent asthma without complication; Allergic conjunctivitis of both eyes; Current singleton pregnancy with history of congenital anomaly in prior child, antepartum; Supervision of high risk pregnancy, antepartum; Obesity affecting pregnancy; 2021 35wk PPROM; BMI 37.0-37.9, adult; Alpha thalassemia silent carrier; Anaphylactic reaction due to food; and History of penicillin allergy  on their problem list.   Patient reports {sx:14538}.   .  .   . Denies leaking of fluid.   [facility] anatomy US  appt.   The following portions of the patient's history were reviewed and updated as appropriate: allergies, current medications, past family history, past medical history, past social history, past surgical history and problem list.   Objective:  There were no vitals filed for this visit.  Fetal Status:           General:  Alert, oriented and cooperative. Patient is in no acute distress.  Skin: Skin is warm and dry. No rash noted.   Cardiovascular: Normal heart rate noted  Respiratory: Normal respiratory effort, no problems with respiration noted  Abdomen: Soft, gravid, appropriate for gestational age.        Pelvic: {Blank single:19197::Cervical exam performed in the presence of a chaperone,Cervical exam deferred}        Extremities: Normal range of motion.     Mental Status: Normal mood and affect. Normal behavior. Normal judgment and thought content.    Assessment and Plan:  Pregnancy: [id number] at [redacted]w[redacted]d 1. Current singleton pregnancy with history of congenital anomaly in prior child, antepartum (Primary)  2. Supervision of high risk pregnancy, antepartum  3. Alpha thalassemia silent carrier  4.  [redacted] weeks gestation of pregnancy  5. 2021 35wk PPROM  Preterm labor symptoms and general obstetric precautions including but not limited to vaginal bleeding, contractions, leaking of fluid and fetal movement were reviewed in detail with the patient. Please refer to After Visit Summary for other counseling recommendations.   No follow-ups on file.  Future Appointments  Date Time Provider Department Center  09/19/2023  3:15 PM Sonya Castro , CNM [facility]  09/30/2023  3:45 PM Sonya Czar, MD [facility] None    Sonya Castro  Sonya Castro [facility] for [facility]

## 2023-09-30 ENCOUNTER — Telehealth: Payer: Self-pay

## 2023-09-30 ENCOUNTER — Ambulatory Visit: Admitting: Internal Medicine

## 2023-09-30 ENCOUNTER — Ambulatory Visit: Attending: Family Medicine

## 2023-09-30 NOTE — Telephone Encounter (Signed)
 Attempted to call patient as she has not joined genetic counseling video visit. Left message with callback number.

## 2023-11-05 ENCOUNTER — Encounter

## 2023-11-18 ENCOUNTER — Encounter: Admitting: Family Medicine
